# Patient Record
Sex: Male | Born: 1970 | Race: White | Hispanic: No | Marital: Single | Smoking: Former smoker
Health system: Southern US, Community
[De-identification: ages and names within clinical notes are randomized; demographics above are authoritative.]

## PROBLEM LIST (undated history)

## (undated) DIAGNOSIS — G7111 Myotonic muscular dystrophy: Secondary | ICD-10-CM

## (undated) HISTORY — PX: CHOLECYSTECTOMY: SHX55

## (undated) HISTORY — PX: ADENOIDECTOMY: SUR15

## (undated) HISTORY — DX: Myotonic muscular dystrophy: G71.11

---

## 1997-06-16 ENCOUNTER — Emergency Department (HOSPITAL_COMMUNITY): Admission: EM | Admit: 1997-06-16 | Discharge: 1997-06-16 | Payer: Self-pay | Admitting: Emergency Medicine

## 1997-11-01 ENCOUNTER — Encounter: Admission: RE | Admit: 1997-11-01 | Discharge: 1997-11-01 | Payer: Self-pay | Admitting: Sports Medicine

## 1998-02-18 ENCOUNTER — Encounter: Payer: Self-pay | Admitting: Emergency Medicine

## 1998-02-18 ENCOUNTER — Emergency Department (HOSPITAL_COMMUNITY): Admission: EM | Admit: 1998-02-18 | Discharge: 1998-02-18 | Payer: Self-pay | Admitting: Emergency Medicine

## 1998-02-21 ENCOUNTER — Encounter: Admission: RE | Admit: 1998-02-21 | Discharge: 1998-02-21 | Payer: Self-pay | Admitting: Sports Medicine

## 1998-04-14 ENCOUNTER — Encounter: Admission: RE | Admit: 1998-04-14 | Discharge: 1998-04-14 | Payer: Self-pay | Admitting: Family Medicine

## 1998-04-26 ENCOUNTER — Encounter: Admission: RE | Admit: 1998-04-26 | Discharge: 1998-04-26 | Payer: Self-pay | Admitting: Family Medicine

## 1998-07-12 ENCOUNTER — Encounter: Admission: RE | Admit: 1998-07-12 | Discharge: 1998-07-12 | Payer: Self-pay | Admitting: Family Medicine

## 1999-06-01 ENCOUNTER — Emergency Department (HOSPITAL_COMMUNITY): Admission: EM | Admit: 1999-06-01 | Discharge: 1999-06-01 | Payer: Self-pay | Admitting: Emergency Medicine

## 1999-11-01 ENCOUNTER — Encounter: Admission: RE | Admit: 1999-11-01 | Discharge: 1999-11-01 | Payer: Self-pay | Admitting: *Deleted

## 1999-11-01 ENCOUNTER — Encounter: Admission: RE | Admit: 1999-11-01 | Discharge: 1999-11-01 | Payer: Self-pay | Admitting: Family Medicine

## 2000-09-22 ENCOUNTER — Ambulatory Visit (HOSPITAL_COMMUNITY): Admission: RE | Admit: 2000-09-22 | Discharge: 2000-09-22 | Payer: Self-pay | Admitting: *Deleted

## 2000-12-01 ENCOUNTER — Emergency Department (HOSPITAL_COMMUNITY): Admission: EM | Admit: 2000-12-01 | Discharge: 2000-12-01 | Payer: Self-pay | Admitting: Emergency Medicine

## 2000-12-01 ENCOUNTER — Encounter: Payer: Self-pay | Admitting: Emergency Medicine

## 2003-05-26 ENCOUNTER — Encounter: Admission: RE | Admit: 2003-05-26 | Discharge: 2003-05-26 | Payer: Self-pay | Admitting: Family Medicine

## 2004-01-17 ENCOUNTER — Inpatient Hospital Stay (HOSPITAL_COMMUNITY): Admission: AD | Admit: 2004-01-17 | Discharge: 2004-01-17 | Payer: Self-pay | Admitting: Obstetrics & Gynecology

## 2004-03-08 ENCOUNTER — Emergency Department (HOSPITAL_COMMUNITY): Admission: EM | Admit: 2004-03-08 | Discharge: 2004-03-08 | Payer: Self-pay | Admitting: Family Medicine

## 2004-04-01 ENCOUNTER — Emergency Department (HOSPITAL_COMMUNITY): Admission: EM | Admit: 2004-04-01 | Discharge: 2004-04-01 | Payer: Self-pay | Admitting: Emergency Medicine

## 2004-07-19 ENCOUNTER — Ambulatory Visit: Payer: Self-pay | Admitting: Family Medicine

## 2004-11-27 ENCOUNTER — Emergency Department (HOSPITAL_COMMUNITY): Admission: EM | Admit: 2004-11-27 | Discharge: 2004-11-27 | Payer: Self-pay | Admitting: Emergency Medicine

## 2004-12-03 ENCOUNTER — Ambulatory Visit: Payer: Self-pay | Admitting: Family Medicine

## 2005-03-05 ENCOUNTER — Ambulatory Visit: Payer: Self-pay | Admitting: Sports Medicine

## 2006-04-07 ENCOUNTER — Ambulatory Visit: Payer: Self-pay | Admitting: Sports Medicine

## 2006-05-01 DIAGNOSIS — J309 Allergic rhinitis, unspecified: Secondary | ICD-10-CM | POA: Insufficient documentation

## 2006-08-26 ENCOUNTER — Telehealth (INDEPENDENT_AMBULATORY_CARE_PROVIDER_SITE_OTHER): Payer: Self-pay | Admitting: Family Medicine

## 2006-09-29 ENCOUNTER — Telehealth: Payer: Self-pay | Admitting: *Deleted

## 2007-01-10 ENCOUNTER — Emergency Department (HOSPITAL_COMMUNITY): Admission: EM | Admit: 2007-01-10 | Discharge: 2007-01-10 | Payer: Self-pay | Admitting: Emergency Medicine

## 2007-01-22 ENCOUNTER — Encounter: Payer: Self-pay | Admitting: Family Medicine

## 2007-07-23 ENCOUNTER — Ambulatory Visit: Payer: Self-pay | Admitting: Family Medicine

## 2007-07-23 ENCOUNTER — Encounter: Payer: Self-pay | Admitting: Family Medicine

## 2007-07-23 DIAGNOSIS — N3944 Nocturnal enuresis: Secondary | ICD-10-CM

## 2007-07-23 DIAGNOSIS — G7111 Myotonic muscular dystrophy: Secondary | ICD-10-CM

## 2007-07-23 LAB — CONVERTED CEMR LAB: Glucose, Bld: 93 mg/dL (ref 70–99)

## 2007-08-17 ENCOUNTER — Encounter: Payer: Self-pay | Admitting: Family Medicine

## 2007-12-31 ENCOUNTER — Ambulatory Visit: Payer: Self-pay | Admitting: Family Medicine

## 2008-05-25 ENCOUNTER — Ambulatory Visit: Payer: Self-pay | Admitting: Family Medicine

## 2008-06-06 ENCOUNTER — Ambulatory Visit: Payer: Self-pay | Admitting: Family Medicine

## 2008-06-06 DIAGNOSIS — D239 Other benign neoplasm of skin, unspecified: Secondary | ICD-10-CM | POA: Insufficient documentation

## 2008-09-02 ENCOUNTER — Encounter: Payer: Self-pay | Admitting: Family Medicine

## 2008-09-18 IMAGING — US US ABDOMEN COMPLETE
1 series · 14 of 25 positions shown · non-contrast
Comparison: none

CLINICAL DATA: Nausea and abdominal pain.  
 ABDOMEN ULTRASOUND:
TECHNIQUE: Complete abdominal ultrasound examination was performed including evaluation of the liver, gallbladder, bile ducts, pancreas, kidneys, spleen, IVC, and abdominal aorta.

[Series 1: unknown · 0.33mm/px · 14 of 66 slices shown]
[im 1/66]
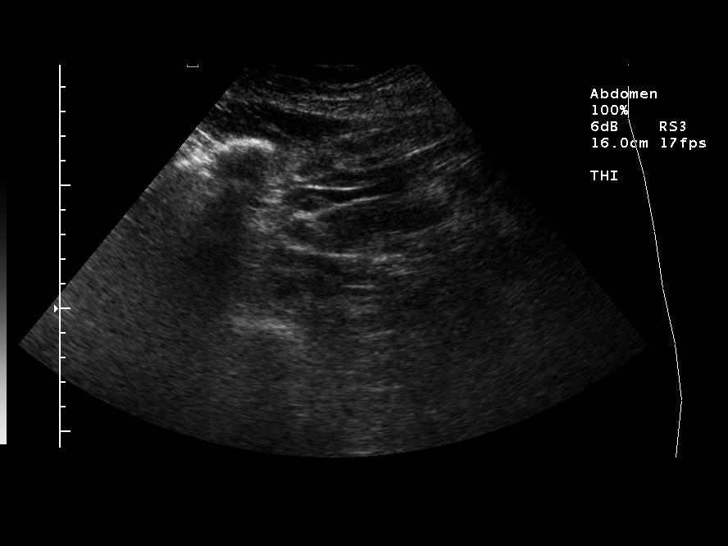
[im 6/66]
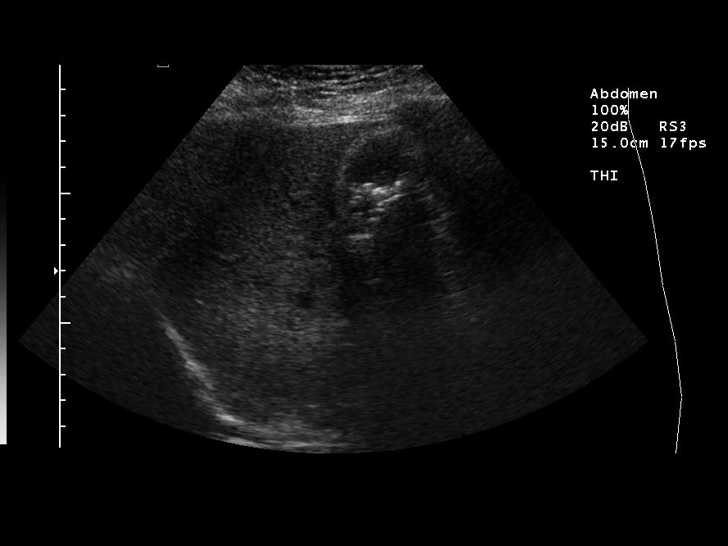
[im 11/66]
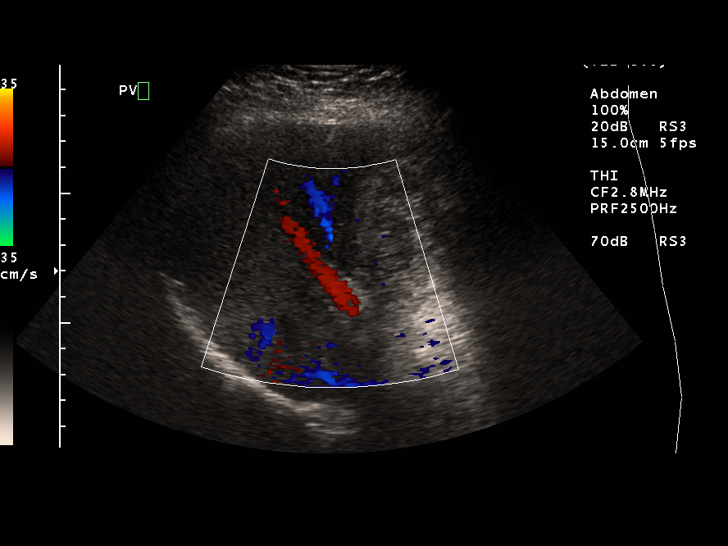
[im 17/66]
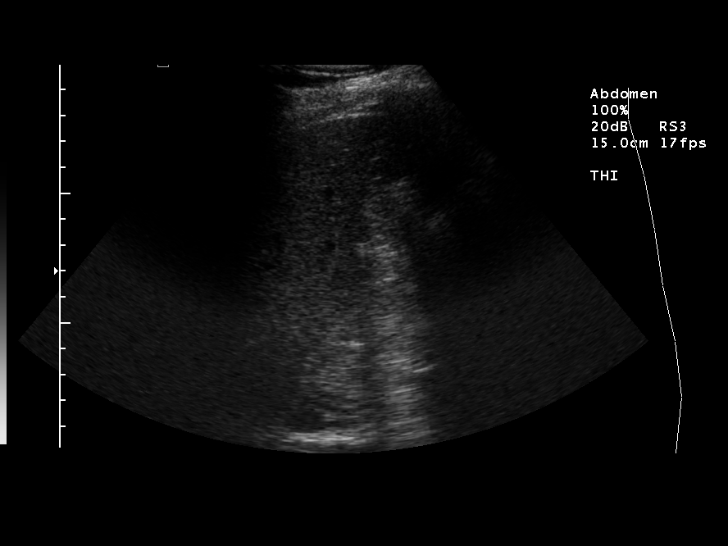
[im 22/66]
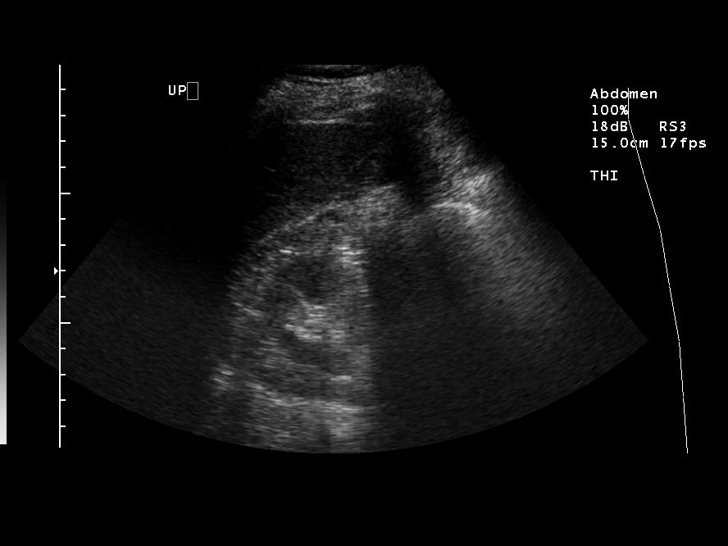
[im 25/66]
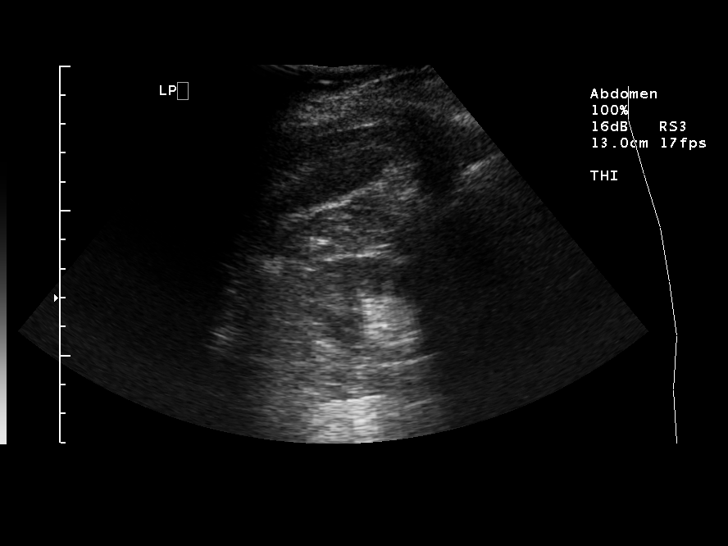
[im 30/66]
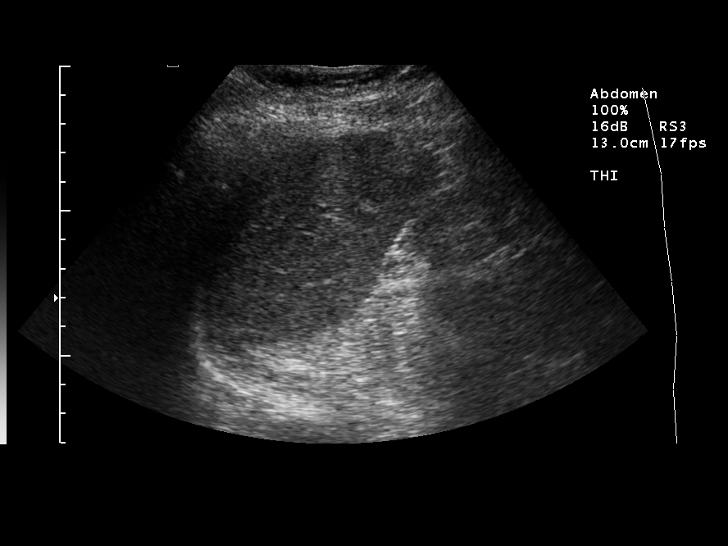
[im 36/66]
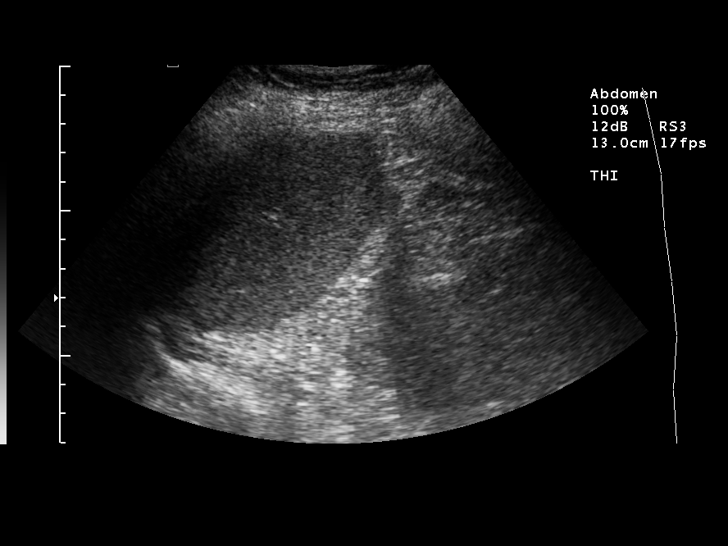
[im 41/66]
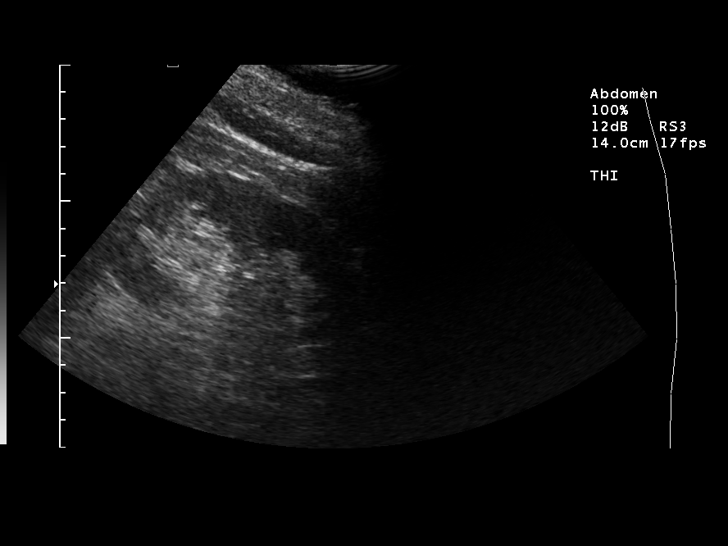
[im 44/66]
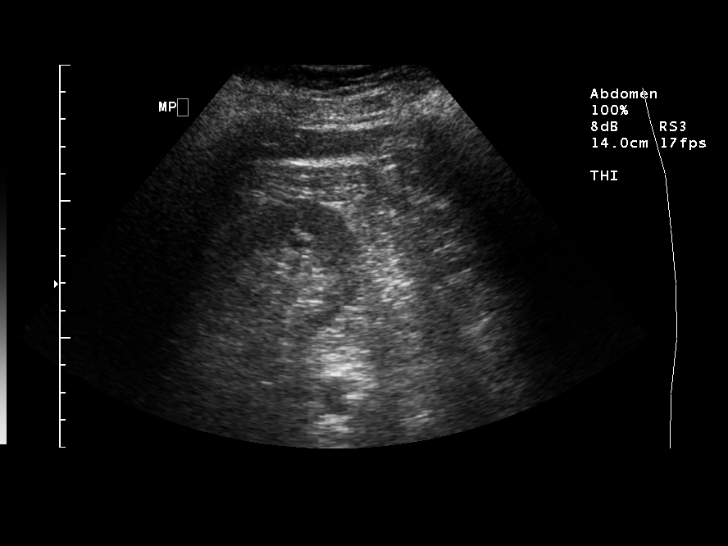
[im 49/66]
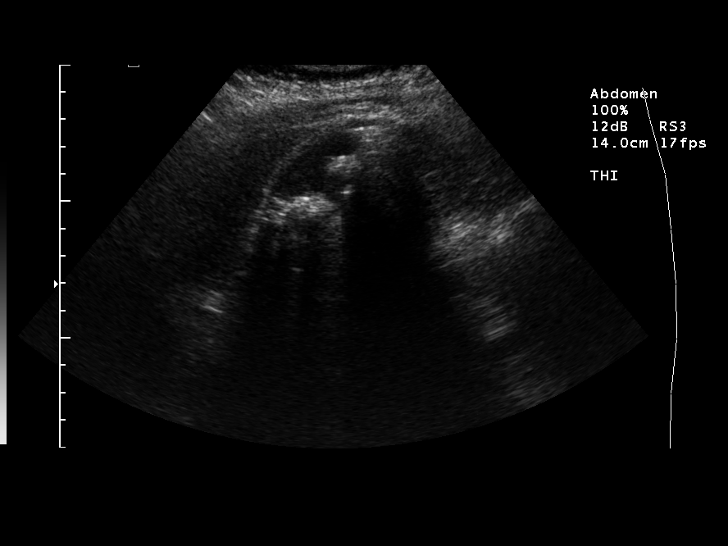
[im 55/66]
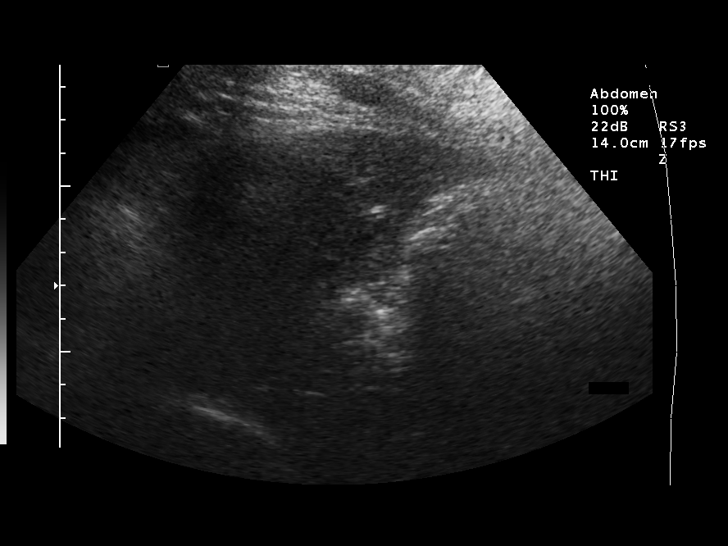
[im 60/66]
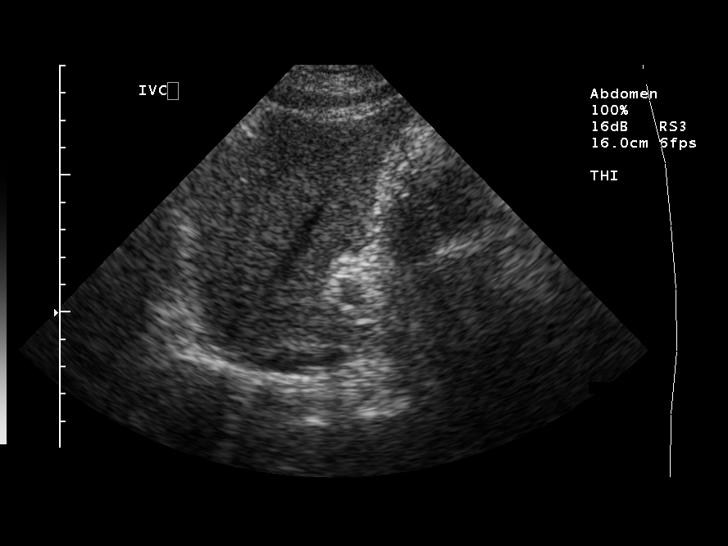
[im 66/66]
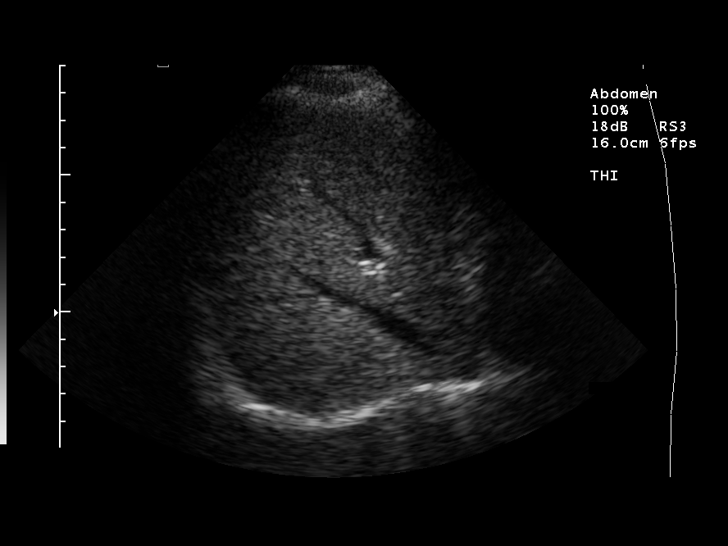

[14 of 25 positions shown; findings below may reference images not displayed]

FINDINGS: There are multiple gallstones.  No gallbladder wall thickening is present.  The patient was not tender on imaging of the gallbladder.  There is no biliary dilatation.  
 Portions of the pancreas and abdominal aorta are obscured by bowel gas.  The visualized spleen, IVC, liver and aorta appear unremarkable.  Both kidneys appear normal measuring 10.2 cm in length on the right and 10.7 cm on the left.
IMPRESSION: 1. Cholelithiasis.  No evidence of cholecystitis or biliary dilatation.
 2. No other significant findings.  Examination is limited by intestinal bowel gas.

## 2008-09-29 ENCOUNTER — Encounter: Payer: Self-pay | Admitting: Family Medicine

## 2009-03-01 ENCOUNTER — Emergency Department (HOSPITAL_COMMUNITY): Admission: EM | Admit: 2009-03-01 | Discharge: 2009-03-01 | Payer: Self-pay | Admitting: Emergency Medicine

## 2009-03-02 ENCOUNTER — Telehealth: Payer: Self-pay | Admitting: Family Medicine

## 2009-03-02 ENCOUNTER — Ambulatory Visit: Payer: Self-pay | Admitting: Family Medicine

## 2009-03-02 DIAGNOSIS — Z8639 Personal history of other endocrine, nutritional and metabolic disease: Secondary | ICD-10-CM

## 2009-03-02 DIAGNOSIS — A088 Other specified intestinal infections: Secondary | ICD-10-CM | POA: Insufficient documentation

## 2009-03-02 DIAGNOSIS — J209 Acute bronchitis, unspecified: Secondary | ICD-10-CM

## 2009-03-02 DIAGNOSIS — Z862 Personal history of diseases of the blood and blood-forming organs and certain disorders involving the immune mechanism: Secondary | ICD-10-CM

## 2009-03-02 DIAGNOSIS — R748 Abnormal levels of other serum enzymes: Secondary | ICD-10-CM | POA: Insufficient documentation

## 2009-03-23 ENCOUNTER — Ambulatory Visit: Payer: Self-pay | Admitting: Family Medicine

## 2009-08-21 ENCOUNTER — Encounter: Payer: Self-pay | Admitting: Family Medicine

## 2009-09-20 ENCOUNTER — Encounter: Payer: Self-pay | Admitting: Family Medicine

## 2009-09-26 ENCOUNTER — Encounter: Payer: Self-pay | Admitting: Family Medicine

## 2010-04-03 NOTE — Consult Note (Signed)
Summary: [facility]  [facility]   Imported By: Clydell Hakim 10/03/2009 11:49:27  _____________________________________________________________________  External Attachment:    Type:   Image     Comment:   External Document

## 2010-04-03 NOTE — Consult Note (Signed)
Summary: [facility] Office visit  [facility] Office visit   Imported By: De Nurse 08/28/2009 14:12:05  _____________________________________________________________________  External Attachment:    Type:   Image     Comment:   External Document

## 2010-04-03 NOTE — Assessment & Plan Note (Signed)
Summary: flu shot/eo   Nurse Visit   Vital Signs:  Patient profile:   41 year old male Temp:     97.8 degrees F  Vitals Entered By: Theresia Lo RN (March 23, 2009 3:22 PM)  Allergies: No Known Drug Allergies  Immunizations Administered:  Influenza Vaccine # 1:    Vaccine Type: Fluvax Non-MCR    Site: left deltoid    Mfr: GlaxoSmithKline    Dose: 0.5 ml    Route: IM    Given by: Theresia Lo RN    Exp. Date: 08/31/2009    Lot #: [id number]    VIS given: 10/11/2008  Flu Vaccine Consent Questions:    Do you have a history of severe allergic reactions to this vaccine? no    Any prior history of allergic reactions to egg and/or gelatin? no    Do you have a sensitivity to the preservative Thimersol? no    Do you have a past history of Guillan-Barre Syndrome? no    Do you currently have an acute febrile illness? no    Have you ever had a severe reaction to latex? no    Vaccine information given and explained to patient? yes  Orders Added: 1)  Influenza Vaccine NON MCR [00028] 2)  Admin 1st Vaccine [90471]     Vital Signs:  Patient profile:   40 year old male Temp:     97.8 degrees F  Vitals Entered By: Theresia Lo RN (March 23, 2009 3:22 PM)

## 2010-06-04 LAB — COMPREHENSIVE METABOLIC PANEL
ALT: 71 U/L — ABNORMAL HIGH (ref 0–53)
Alkaline Phosphatase: 126 U/L — ABNORMAL HIGH (ref 39–117)
CO2: 29 mEq/L (ref 19–32)
Calcium: 9 mg/dL (ref 8.4–10.5)
GFR calc Af Amer: >60 mL/min (ref >60)
Glucose, Bld: 102 mg/dL — ABNORMAL HIGH (ref 70–99)
Sodium: 139 mEq/L (ref 135–145)
Total Bilirubin: 2 mg/dL — ABNORMAL HIGH (ref 0.3–1.2)
Total Protein: 6.8 g/dL (ref 6.0–8.3)

## 2010-06-04 LAB — DIFFERENTIAL
Basophils Absolute: 0 10*3/uL (ref 0.0–0.1)
Lymphocytes Relative: 16 % (ref 12–46)
Monocytes Relative: 11 % (ref 3–12)

## 2010-06-04 LAB — CBC
HCT: 47.1 % (ref 39.0–52.0)
Hemoglobin: 16.1 g/dL (ref 13.0–17.0)
MCV: 88.2 fL (ref 78.0–100.0)
Platelets: 166 10*3/uL (ref 150–400)
RDW: 13.2 % (ref 11.5–15.5)
WBC: 6.7 10*3/uL (ref 4.0–10.5)

## 2010-06-04 LAB — URINALYSIS, ROUTINE W REFLEX MICROSCOPIC
Hgb urine dipstick: NEGATIVE
Ketones, ur: NEGATIVE mg/dL
Nitrite: NEGATIVE
Protein, ur: NEGATIVE mg/dL
Specific Gravity, Urine: 1.023 (ref 1.005–1.030)

## 2010-06-13 ENCOUNTER — Ambulatory Visit (INDEPENDENT_AMBULATORY_CARE_PROVIDER_SITE_OTHER): Payer: Medicaid Other | Admitting: Family Medicine

## 2010-06-13 ENCOUNTER — Encounter: Payer: Self-pay | Admitting: Family Medicine

## 2010-06-13 DIAGNOSIS — Z8639 Personal history of other endocrine, nutritional and metabolic disease: Secondary | ICD-10-CM

## 2010-06-13 DIAGNOSIS — G7111 Myotonic muscular dystrophy: Secondary | ICD-10-CM

## 2010-06-15 ENCOUNTER — Encounter: Payer: Self-pay | Admitting: Family Medicine

## 2010-06-15 NOTE — Assessment & Plan Note (Signed)
Discussed this with patient.  He states no one has ever told him he has had problems with liver in past. After discussion, patient declines any screening or LFTs at this time.  Recommended he return in 3-6 months for fu LFTs.   He states he will think about this.

## 2010-06-15 NOTE — Assessment & Plan Note (Signed)
No current complications.  No changes in management per Cards/Neurology.   Will follow.

## 2010-06-15 NOTE — Progress Notes (Signed)
  Subjective:    Patient ID: Peter Steele, male    DOB: 08/01/1970, 40 y.o.   MRN: [id number]  HPI Patient presents for annual CPE Denies any current symptoms, recent illnesses, medications, hospitalizations. 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  Followed by [facility]rology and [facility] for this.  No problems or concerns, simply yearly follow-up exams with them.  No complications, worsening of weakness.   Review of Systems The patient denies fever, unusual weight change, decreased hearing, chest pain, palpitations, pre-syncopal or syncopal episodes, dyspnea on exertion, prolonged cough, hemoptysis, change in bowel habits, melena, hematochezia, severe indigestion/heartburn, nausea/vomiting/abdominal pain, genital sores, muscle weakness, difficulty walking, abnormal bleeding, or enlarged lymph nodes.       Objective:   Physical Exam Gen:  Alert, cooperative patient who appears stated age in no acute distress.  Vital signs reviewed. HEENT:  [place]/AT.  EOMI, PERRL.  MMM, tonsils non-erythematous, non-edematous.  External ears WNL, Bilateral TM's normal without retraction, redness or bulging.  Neck:  Supple, no LAD Cardiac:  Regular rate and rhythm without murmur auscultated.  Good S1/S2. Pulm:  Clear to auscultation bilaterally with good air movement.  No wheezes or rales noted.   Abd:  Soft/nondistended/nontender.  Good bowel sounds throughout all four quadrants.  No masses noted.  Unable to palpate any hepatomegaly Ext:  No clubbing/cyanosis/erythema.  No edema noted bilateral lower extremities.   Neuro:  CN II-XII intact.  No focal sensory or motor deficits.  Alert and oriented x 3.        Assessment & Plan:

## 2010-11-08 IMAGING — CR DG CHEST 2V
2 series · 2 of 2 positions shown · non-contrast
Comparison: 11/27/2004

CLINICAL DATA: Vomiting.  Cough.  Congestion.

CHEST - 2 VIEW

[w chest pa]
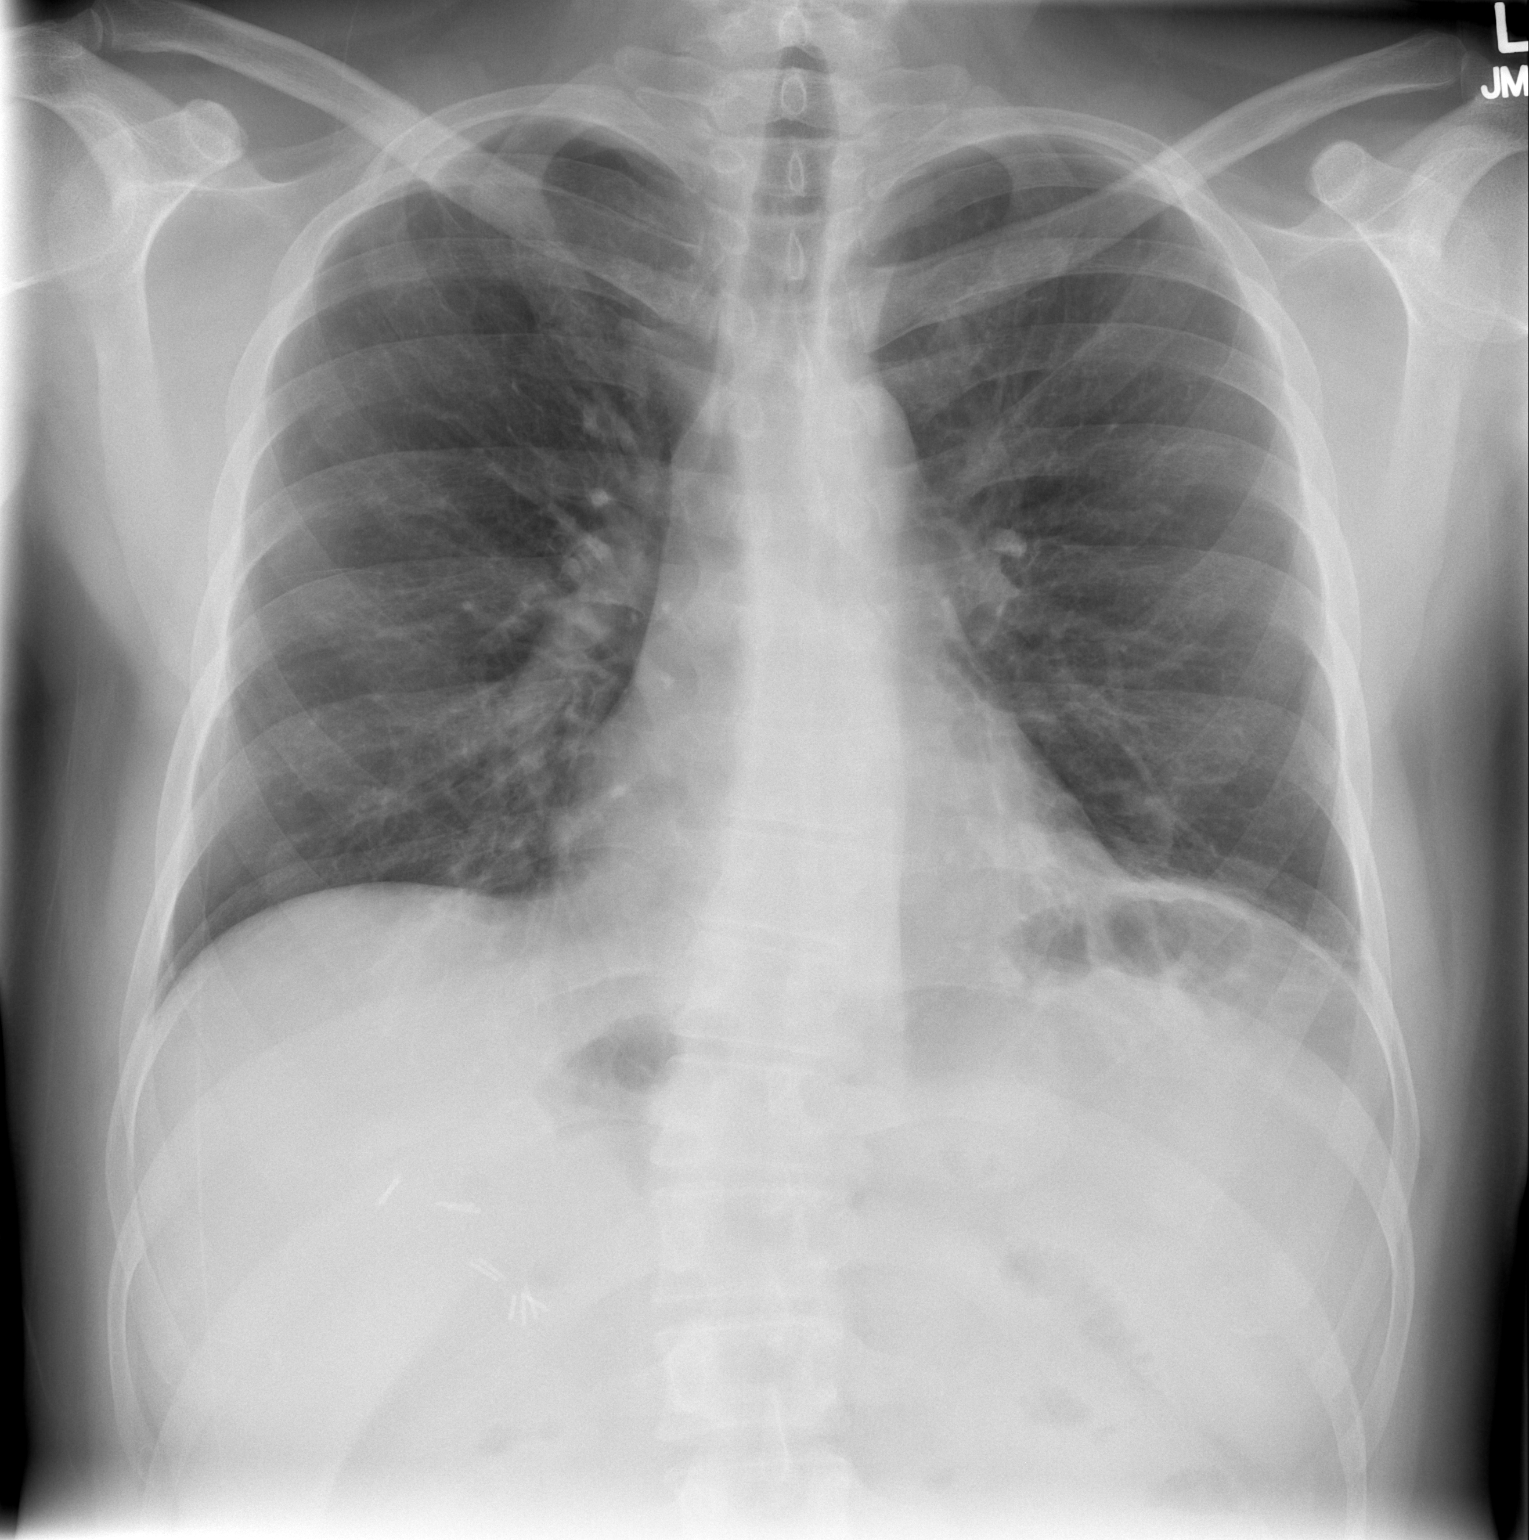

[w chest lat]
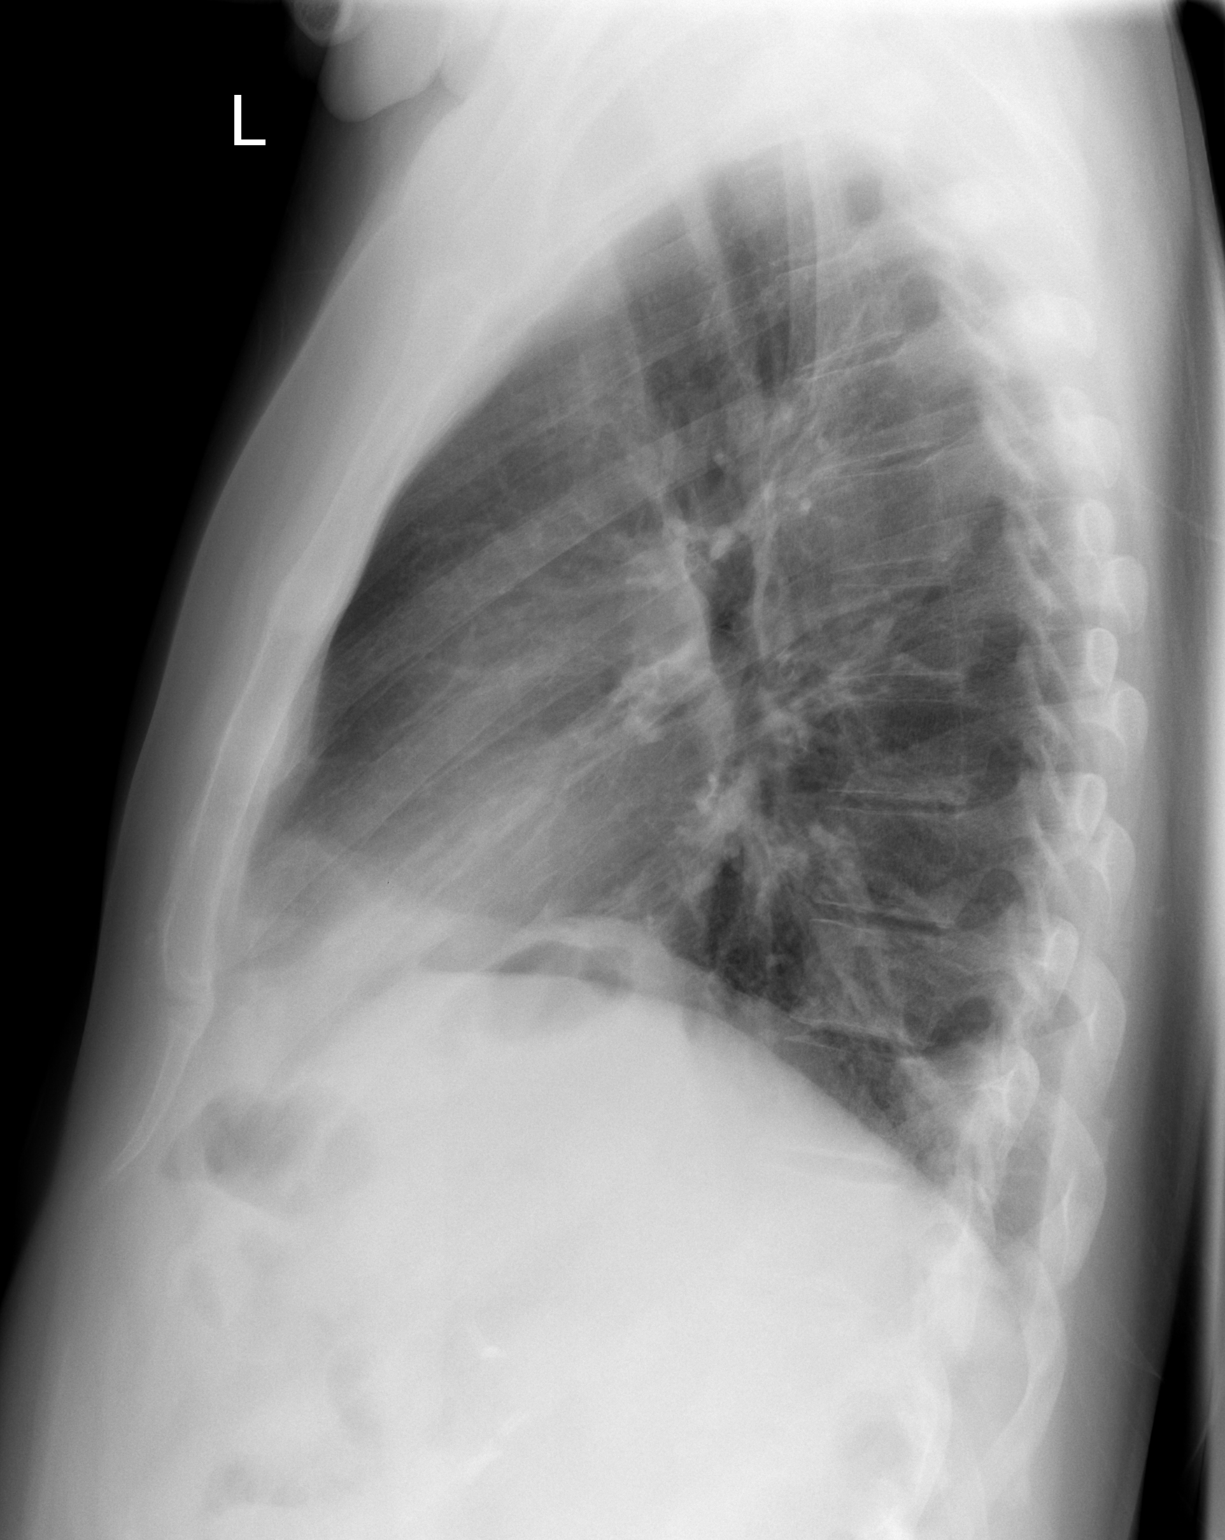

[2 of 2 positions shown; findings below may reference images not displayed]

FINDINGS: Midline trachea.  Normal heart size and mediastinal
contours. No pleural effusion or pneumothorax.  Diffuse
peribronchial thickening.  Volume loss and scar/atelectasis at the
lung bases.  Surgical clips in the right upper quadrant.  Mild S-
shaped spinal curvature.
IMPRESSION: 1.  No acute cardiopulmonary disease.
2.  Mild peribronchial thickening which may relate to chronic
bronchitis or smoking.

## 2010-12-11 LAB — COMPREHENSIVE METABOLIC PANEL
AST: 27
Calcium: 9.2
Creatinine, Ser: 0.87
GFR calc Af Amer: >60
GFR calc non Af Amer: >60
Glucose, Bld: 127 — ABNORMAL HIGH
Potassium: 4
Sodium: 145
Total Bilirubin: 1
Total Protein: 6.6

## 2010-12-11 LAB — CBC
Platelets: 236
WBC: 7.9

## 2010-12-11 LAB — DIFFERENTIAL
Basophils Absolute: 0
Basophils Relative: 0
Eosinophils Absolute: 0.1
Lymphocytes Relative: 19
Monocytes Relative: 8

## 2010-12-11 LAB — LIPASE, BLOOD: Lipase: 18

## 2017-12-23 ENCOUNTER — Encounter

## 2018-04-16 ENCOUNTER — Other Ambulatory Visit: Payer: Self-pay

## 2018-04-16 ENCOUNTER — Ambulatory Visit: Payer: Medicaid Other | Admitting: Family Medicine

## 2018-04-16 ENCOUNTER — Encounter: Payer: Self-pay | Admitting: Family Medicine

## 2018-04-16 ENCOUNTER — Ambulatory Visit (HOSPITAL_COMMUNITY)
Admission: RE | Admit: 2018-04-16 | Discharge: 2018-04-16 | Disposition: A | Discharge status: Home / Self Care | Payer: Medicaid Other | Source: Ambulatory Visit | Attending: Family Medicine | Admitting: Family Medicine

## 2018-04-16 VITALS — BP 110/80 | HR 67 | Temp 97.6°F | Ht 66.0 in | Wt 174.4 lb

## 2018-04-16 DIAGNOSIS — H918X1 Other specified hearing loss, right ear: Secondary | ICD-10-CM | POA: Diagnosis not present

## 2018-04-16 DIAGNOSIS — I447 Left bundle-branch block, unspecified: Secondary | ICD-10-CM

## 2018-04-16 DIAGNOSIS — G7111 Myotonic muscular dystrophy: Secondary | ICD-10-CM

## 2018-04-16 DIAGNOSIS — R1310 Dysphagia, unspecified: Secondary | ICD-10-CM | POA: Diagnosis not present

## 2018-04-16 DIAGNOSIS — I440 Atrioventricular block, first degree: Secondary | ICD-10-CM | POA: Diagnosis not present

## 2018-04-16 LAB — POCT GLYCOSYLATED HEMOGLOBIN (HGB A1C): HEMOGLOBIN A1C: 5.3 % (ref 4.0–5.6)

## 2018-04-16 NOTE — Progress Notes (Signed)
Subjective:     Peter Steele is a 48 y.o. male and is here for a comprehensive physical exam. The patient reports no problems.  His primary goal in this appointment today is to establish care with a primary care provider to coordinate his care with specialists for his myotonic muscular dystrophy.  He has not seen any medical provider in about 10 years.  We spoke at length today much of the social history seen below in addition to his myotonic muscular dystrophy.  He reports that he has had trouble swallowing for years related to his muscular dystrophy and he now avoids certain foods like steak.  He reports that he is seen neurology and cardiology in the past at the he has never been prescribed medication to be taken regularly.  He does not recall being diagnosed with any specific problems from the specialists.  Social History   Socioeconomic History  . Marital status: Legally Separated    Spouse name: Not on file  . Number of children: Not on file  . Years of education: Not on file  . Highest education level: Not on file  Occupational History  . Not on file  Social Needs  . Financial resource strain: Not on file  . Food insecurity:    Worry: Not on file    Inability: Not on file  . Transportation needs:    Medical: Not on file    Non-medical: Not on file  Tobacco Use  . Smoking status: Former Smoker    Last attempt to quit: 06/13/1995    Years since quitting: 22.8  . Smokeless tobacco: Never Used  Substance and Sexual Activity  . Alcohol use: Not on file  . Drug use: Not on file  . Sexual activity: Not on file  Lifestyle  . Physical activity:    Days per week: Not on file    Minutes per session: Not on file  . Stress: Not on file  Relationships  . Social connections:    Talks on phone: Not on file    Gets together: Not on file    Attends religious service: Not on file    Active member of club or organization: Not on file    Attends meetings of clubs or  organizations: Not on file    Relationship status: Not on file  . Intimate partner violence:    Fear of current or ex partner: Not on file    Emotionally abused: Not on file    Physically abused: Not on file    Forced sexual activity: Not on file  Other Topics Concern  . Not on file  Social History Narrative  . Not on file   Health Maintenance  Topic Date Due  . HIV Screening  12/06/1984  . TETANUS/TDAP  02/02/2012  . INFLUENZA VACCINE  10/02/2017    The following portions of the patient's history were reviewed and updated as appropriate: allergies, current medications, past family history, past medical history, past social history, past surgical history and problem list.  Review of Systems Pertinent items are noted in HPI.   Objective:    BP 110/80   Pulse 67   Temp 97.6 F (36.4 C) (Oral)   Ht 5\' 6"  (1.676 m)   Wt 174 lb 6.4 oz (79.1 kg)   SpO2 99%   BMI 28.15 kg/m  General appearance: alert, cooperative and appears stated age Head: Normocephalic, without obvious abnormality, atraumatic, polyp-like 1.5 cm exophytic nevus noted on right parietal scalp  Ears: TMs visualized bilaterally normal-appearing, gray, no signs of lesion, no cerumen impaction Throat: lips, mucosa, and tongue normal; teeth and gums normal Neck: no adenopathy, no carotid bruit, no JVD and supple, symmetrical, trachea midline Back: symmetric, no curvature. ROM normal. No CVA tenderness. Lungs: clear to auscultation bilaterally Chest wall: no tenderness Heart: regular rate and rhythm, S1, S2 normal, no murmur, click, rub or gallop Abdomen: soft, non-tender; bowel sounds normal; no masses,  no organomegaly Extremities: lack of muscle mass on extremities Skin: Skin color, texture, turgor normal. No rashes or lesions Neurologic: Gait: Normal cranial nerves grossly intact   Assessment:   A 48 year old male with a previous medical history significant for myotonic muscular dystrophy presenting for a  visit to establish care.  He notes that he has not seen many medical providers for about 10 years and believes he may require some follow-up.  Plan:  Routine care for myoclonic muscular dystrophy -Referral to ophthalmology, assessment for cataracts every 2 years -A1c within normal limits today in clinic -Screening EKG for conduction delays showed first-degree block in addition to incomplete left bundle branch block.  Will refer to cardiology. -BMP,CBC,CK, lipid panel ordered today but pt did not want to have blood drawn so they were put as future orders and he was encouraged to return for the blood draw soon.  Hearing loss of the right ear -referral to audiology  Dysphagia -referral to speech language pathology   See After Visit Summary for Counseling Recommendations

## 2018-04-16 NOTE — Patient Instructions (Addendum)
It was great to meet you today!  I'm glad we got to cover some of the basics of your medical history. Your labs from today looked good but I would like you to come back for blood work when you feel comfortable. You can walk into the clinic when you are able (an advanced call is nice) and have the blood work done at [facility].  I have put in referrals to ENT and Ophthalmology.  I will follow up with possible referrals to Cardiology and Neurology.

## 2018-04-17 ENCOUNTER — Other Ambulatory Visit (HOSPITAL_COMMUNITY): Payer: Medicaid Other

## 2018-04-17 ENCOUNTER — Inpatient Hospital Stay (HOSPITAL_COMMUNITY): Admission: RE | Admit: 2018-04-17 | Payer: Medicaid Other | Source: Ambulatory Visit

## 2018-05-21 ENCOUNTER — Encounter: Payer: Self-pay | Admitting: Family Medicine

## 2018-07-07 ENCOUNTER — Telehealth: Payer: Self-pay | Admitting: *Deleted

## 2018-07-07 NOTE — Telephone Encounter (Signed)
Unable to leave message due to voicemail not set up.  Will continue to try and reach him.  Letter also mailed to patient. Jazmin Hartsell,CMA

## 2018-10-19 DIAGNOSIS — H52223 Regular astigmatism, bilateral: Secondary | ICD-10-CM | POA: Diagnosis not present

## 2018-10-19 DIAGNOSIS — H5213 Myopia, bilateral: Secondary | ICD-10-CM | POA: Diagnosis not present

## 2018-10-20 DIAGNOSIS — H5213 Myopia, bilateral: Secondary | ICD-10-CM | POA: Diagnosis not present

## 2018-11-10 DIAGNOSIS — H353111 Nonexudative age-related macular degeneration, right eye, early dry stage: Secondary | ICD-10-CM | POA: Diagnosis not present

## 2018-11-10 DIAGNOSIS — H0102A Squamous blepharitis right eye, upper and lower eyelids: Secondary | ICD-10-CM | POA: Diagnosis not present

## 2018-11-10 DIAGNOSIS — H0102B Squamous blepharitis left eye, upper and lower eyelids: Secondary | ICD-10-CM | POA: Diagnosis not present

## 2018-11-25 DIAGNOSIS — H524 Presbyopia: Secondary | ICD-10-CM | POA: Diagnosis not present

## 2019-04-16 ENCOUNTER — Other Ambulatory Visit: Payer: Medicaid Other

## 2019-04-16 ENCOUNTER — Other Ambulatory Visit: Payer: Self-pay

## 2019-04-16 ENCOUNTER — Ambulatory Visit: Payer: Medicaid Other | Attending: Internal Medicine

## 2019-04-16 DIAGNOSIS — Z20822 Contact with and (suspected) exposure to covid-19: Secondary | ICD-10-CM

## 2019-04-17 LAB — NOVEL CORONAVIRUS, NAA: SARS-CoV-2, NAA: NOT DETECTED

## 2019-05-10 DIAGNOSIS — H353111 Nonexudative age-related macular degeneration, right eye, early dry stage: Secondary | ICD-10-CM | POA: Diagnosis not present

## 2019-05-10 DIAGNOSIS — H25013 Cortical age-related cataract, bilateral: Secondary | ICD-10-CM | POA: Diagnosis not present

## 2019-05-10 DIAGNOSIS — H0102A Squamous blepharitis right eye, upper and lower eyelids: Secondary | ICD-10-CM | POA: Diagnosis not present

## 2019-05-10 DIAGNOSIS — H0102B Squamous blepharitis left eye, upper and lower eyelids: Secondary | ICD-10-CM | POA: Diagnosis not present

## 2019-05-21 ENCOUNTER — Other Ambulatory Visit: Payer: Self-pay

## 2019-05-21 ENCOUNTER — Ambulatory Visit (HOSPITAL_COMMUNITY)
Admission: RE | Admit: 2019-05-21 | Discharge: 2019-05-21 | Disposition: A | Discharge status: Home / Self Care | Payer: Medicaid Other | Source: Ambulatory Visit | Attending: Family Medicine | Admitting: Family Medicine

## 2019-05-21 ENCOUNTER — Encounter: Payer: Self-pay | Admitting: Family Medicine

## 2019-05-21 ENCOUNTER — Ambulatory Visit: Payer: Medicaid Other | Admitting: Family Medicine

## 2019-05-21 VITALS — BP 102/60 | HR 86 | Ht 66.0 in | Wt 167.2 lb

## 2019-05-21 DIAGNOSIS — G7111 Myotonic muscular dystrophy: Secondary | ICD-10-CM | POA: Diagnosis not present

## 2019-05-21 NOTE — Patient Instructions (Signed)
It was good to see you again Peter Steele.  We will place those orders for referral to cardiology, neurology, speech therapy, audiology.  Please make sure that you have our number in your phone and please stop at the front desk to make sure we have the best contact information for you.  The phone number for the clinic is [phone].  Please call the clinic if you have not received any phone calls about these referrals in the next 2 weeks.  I will let you know if there is any concerning findings in your lab work.

## 2019-05-22 LAB — CBC
Hematocrit: 48.5 % (ref 37.5–51.0)
Hemoglobin: 16 g/dL (ref 13.0–17.7)
MCH: 29 pg (ref 26.6–33.0)
MCHC: 33 g/dL (ref 31.5–35.7)
MCV: 88 fL (ref 79–97)
Platelets: 221 10*3/uL (ref 150–450)
RBC: 5.51 x10E6/uL (ref 4.14–5.80)
RDW: 13.7 % (ref 11.6–15.4)
WBC: 7.5 10*3/uL (ref 3.4–10.8)

## 2019-05-22 LAB — BASIC METABOLIC PANEL
BUN/Creatinine Ratio: 16 (ref 9–20)
BUN: 15 mg/dL (ref 6–24)
CO2: 25 mmol/L (ref 20–29)
Calcium: 9.2 mg/dL (ref 8.7–10.2)
Chloride: 112 mmol/L — ABNORMAL HIGH (ref 96–106)
Creatinine, Ser: 0.93 mg/dL (ref 0.76–1.27)
GFR calc Af Amer: 111 mL/min/{1.73_m2} (ref >59)
GFR calc non Af Amer: 96 mL/min/{1.73_m2} (ref >59)
Glucose: 97 mg/dL (ref 65–99)
Potassium: 4.9 mmol/L (ref 3.5–5.2)
Sodium: 149 mmol/L — ABNORMAL HIGH (ref 134–144)

## 2019-05-22 LAB — CK: Total CK: 66 U/L (ref 49–439)

## 2019-05-23 NOTE — Assessment & Plan Note (Addendum)
His EKG continues to show first-degree heart block with an interventricular conduction delay.  His access to care was impeded last year due to difficulty contacting him via phone.  He was encouraged to set up a voicemail on his phone so that secure messages could be left.  We verified that we have the correct phone number in our system. -Ambulatory referral to cardiology -Ambulatory referral to neurology -Ambulatory referral to speech therapy -Ambulatory referral to audiology -No abnormalities noted on blood work

## 2019-05-23 NOTE — Progress Notes (Signed)
    SUBJECTIVE:   CHIEF COMPLAINT / HPI:   Peter Steele presents to clinic today for his annual checkup.  He was seen 1 year ago and was hoping to get reconnected with many specialists for routine care of his myotonic muscular dystrophy.  At the last visit, referrals to cardiology, speech therapy and audiology were placed.  None of these referrals were followed up with due to difficulties communicating with Peter Steele.  In clinic today, he has no new complaints and notes again that he would like to be reconnected with his specialists.  Last year he noted some difficulty swallowing.  He continues to have some difficulty swallowing though this does not seem to have progressed at all since our last conversation.  PERTINENT  PMH / PSH: Myotonic muscular dystrophy  OBJECTIVE:   BP 102/60   Pulse 86   Ht 5\' 6"  (1.676 m)   Wt 167 lb 4 oz (75.9 kg)   SpO2 95%   BMI 26.99 kg/m    General: Alert and cooperative and appears to be in no acute distress HEENT: Neck non-tender without lymphadenopathy, masses or thyromegaly Cardio: Normal S1 and S2, no S3 or S4. Rhythm is regular. No murmurs or rubs.   Pulm: Clear to auscultation bilaterally, no crackles, wheezing, or diminished breath sounds. Normal respiratory effort Abdomen: Bowel sounds normal. Abdomen soft and non-tender.  Extremities: No peripheral edema. Warm/ well perfused.  Strong radial pulse. Neuro: Cranial nerves grossly intact   ASSESSMENT/PLAN:   MYOTONIC MUSCULAR DYSTROPHY His EKG continues to show first-degree heart block with an interventricular conduction delay.  His access to care was impeded last year due to difficulty contacting him via phone.  He was encouraged to set up a voicemail on his phone so that secure messages could be left.  We verified that we have the correct phone number in our system. -Ambulatory referral to cardiology -Ambulatory referral to neurology -Ambulatory referral to speech therapy -Ambulatory  referral to audiology -No abnormalities noted on blood work     Peter Haymaker, MD [place]

## 2019-06-08 NOTE — Progress Notes (Signed)
Cardiology Office Note:    Date:  06/10/2019   ID:  Peter Steele, DOB Apr 30, 1970, MRN [id number]  PCP:  Matilde Haymaker, MD  Cardiologist:  No primary care provider on file.  Electrophysiologist:  None   Referring MD: Lind Covert, *   Chief Complaint  Patient presents with  . Abnormal ECG    History of Present Illness:    Peter Steele is a 49 y.o. male with a hx of myotonic muscular dystrophy who is referred by Dr. Erin Hearing for evaluation of cardiac involvement of his muscular dystrophy.  Mr. Bibb reports that he has been doing well.  He denies any chest pain, dyspnea, lightheadedness, syncope, or palpitations.  He does exercises at home gym, denies any symptoms.   Past Medical History:  Diagnosis Date  . Myotonic muscular dystrophy ([place])    Followed by [facility] Neurology for this    Past Surgical History:  Procedure Laterality Date  . CHOLECYSTECTOMY     Can't remember year    Current Medications: No outpatient medications have been marked as taking for the 06/10/19 encounter (Office Visit) with Donato Heinz, MD.     Allergies:   Patient has no known allergies.   Social History   Socioeconomic History  . Marital status: Single    Spouse name: Not on file  . Number of children: Not on file  . Years of education: Not on file  . Highest education level: Not on file  Occupational History  . Occupation: not employed  Tobacco Use  . Smoking status: Former Smoker    Quit date: 06/13/1995    Years since quitting: 24.0  . Smokeless tobacco: Never Used  Substance and Sexual Activity  . Alcohol use: Not Currently  . Drug use: Not Currently  . Sexual activity: Not Currently    Comment: Girlfriend died 5 years ago. not active since  Other Topics Concern  . Not on file  Social History Narrative  . Not on file   Social Determinants of Health   Financial Resource Strain:   . Difficulty of Paying Living Expenses:   Food Insecurity:    . Worried About Charity fundraiser in the Last Year:   . Arboriculturist in the Last Year:   Transportation Needs:   . Film/video editor (Medical):   Marland Kitchen Lack of Transportation (Non-Medical):   Physical Activity:   . Days of Exercise per Week:   . Minutes of Exercise per Session:   Stress:   . Feeling of Stress :   Social Connections:   . Frequency of Communication with Friends and Family:   . Frequency of Social Gatherings with Friends and Family:   . Attends Religious Services:   . Active Member of Clubs or Organizations:   . Attends Archivist Meetings:   Marland Kitchen Marital Status:      Family History: The patient's family history includes Asthma in his sister; Diabetes in his father; Early death in his mother; Heart disease in his father, mother, and sister; High blood pressure in his father and sister; Hyperlipidemia in his father and sister; Stroke in his mother.  ROS:   Please see the history of present illness.     All other systems reviewed and are negative.  EKGs/Labs/Other Studies Reviewed:    The following studies were reviewed today:   EKG:  EKG is ordered today.  The ekg ordered today demonstrates normal sinus rhythm, rate 65, early repolarization, nonspecific  interventricular conduction delay  Recent Labs: 05/21/2019: BUN 15; Creatinine, Ser 0.93; Hemoglobin 16.0; Platelets 221; Potassium 4.9; Sodium 149  Recent Lipid Panel No results found for: CHOL, TRIG, HDL, CHOLHDL, VLDL, LDLCALC, LDLDIRECT  Physical Exam:    VS:  BP 110/78   Pulse 65   Temp (!) 97 F (36.1 C)   Ht 5\' 5"  (1.651 m)   Wt 162 lb (73.5 kg)   SpO2 97%   BMI 26.96 kg/m     Wt Readings from Last 3 Encounters:  06/10/19 162 lb (73.5 kg)  05/21/19 167 lb 4 oz (75.9 kg)  04/16/18 174 lb 6.4 oz (79.1 kg)     GEN:  in no acute distress HEENT: Normal NECK: No JVD LYMPHATICS: No lymphadenopathy CARDIAC: RRR, no murmurs, rubs, gallops RESPIRATORY:  Clear to auscultation without  rales, wheezing or rhonchi  ABDOMEN: Soft, non-tender, non-distended  MUSCULOSKELETAL:  No edema SKIN: Warm and dry NEUROLOGIC:  Alert and oriented x 3 PSYCHIATRIC:  Normal affect   ASSESSMENT:    1. MYOTONIC MUSCULAR DYSTROPHY   2. Abnormal EKG    PLAN:    Myotonic muscular dystrophy: Has been associated with increased risk of cardiomyopathy, conduction disorders, and arrhythmias.  He is currently asymptomatic.  EKG with nonspecific intraventricular conduction delay.  Will check echocardiogram  RTC in 3 months   Medication Adjustments/Labs and Tests Ordered: Current medicines are reviewed at length with the patient today.  Concerns regarding medicines are outlined above.  Orders Placed This Encounter  Procedures  . EKG 12-Lead  . ECHOCARDIOGRAM COMPLETE   No orders of the defined types were placed in this encounter.   Patient Instructions  Medication Instructions:  Your physician recommends that you continue on your current medications as directed. Please refer to the Current Medication list given to you today.  Lab Work: NONE  Testing/Procedures: Your physician has requested that you have an echocardiogram. Echocardiography is a painless test that uses sound waves to create images of your heart. It provides your doctor with information about the size and shape of your heart and how well your heart's chambers and valves are working. This procedure takes approximately one hour. There are no restrictions for this procedure.  This will be done at our [facility] location:  [place]: At [facility], you and your health needs are our priority.  As part of our continuing mission to provide you with exceptional heart care, we have created designated Provider Care Teams.  These Care Teams include your primary Cardiologist (physician) and Advanced Practice Providers (APPs -  Physician Assistants and Nurse Practitioners) who all work together to  provide you with the care you need, when you need it.  We recommend signing up for the patient portal called "MyChart".  Sign up information is provided on this After Visit Summary.  MyChart is used to connect with patients for Virtual Visits (Telemedicine).  Patients are able to view lab/test results, encounter notes, upcoming appointments, etc.  Non-urgent messages can be sent to your provider as well.   To learn more about what you can do with MyChart, go to [email or web address].    Your next appointment:   3 month(s)  The format for your next appointment:   In Person  Provider:   Oswaldo Milian, MD         Signed, Donato Heinz, MD  06/10/2019 9:28 AM    [place]

## 2019-06-10 ENCOUNTER — Encounter: Payer: Self-pay | Admitting: Cardiology

## 2019-06-10 ENCOUNTER — Other Ambulatory Visit: Payer: Self-pay

## 2019-06-10 ENCOUNTER — Ambulatory Visit: Payer: Medicaid Other | Admitting: Cardiology

## 2019-06-10 VITALS — BP 110/78 | HR 65 | Temp 97.0°F | Ht 65.0 in | Wt 162.0 lb

## 2019-06-10 DIAGNOSIS — G7111 Myotonic muscular dystrophy: Secondary | ICD-10-CM

## 2019-06-10 DIAGNOSIS — R9431 Abnormal electrocardiogram [ECG] [EKG]: Secondary | ICD-10-CM | POA: Diagnosis not present

## 2019-06-10 NOTE — Patient Instructions (Signed)
Medication Instructions:  Your physician recommends that you continue on your current medications as directed. Please refer to the Current Medication list given to you today.  Lab Work: NONE  Testing/Procedures: Your physician has requested that you have an echocardiogram. Echocardiography is a painless test that uses sound waves to create images of your heart. It provides your doctor with information about the size and shape of your heart and how well your heart's chambers and valves are working. This procedure takes approximately one hour. There are no restrictions for this procedure.  This will be done at our [facility] location:  [place]: At [facility], you and your health needs are our priority.  As part of our continuing mission to provide you with exceptional heart care, we have created designated Provider Care Teams.  These Care Teams include your primary Cardiologist (physician) and Advanced Practice Providers (APPs -  Physician Assistants and Nurse Practitioners) who all work together to provide you with the care you need, when you need it.  We recommend signing up for the patient portal called "MyChart".  Sign up information is provided on this After Visit Summary.  MyChart is used to connect with patients for Virtual Visits (Telemedicine).  Patients are able to view lab/test results, encounter notes, upcoming appointments, etc.  Non-urgent messages can be sent to your provider as well.   To learn more about what you can do with MyChart, go to [email or web address].    Your next appointment:   3 month(s)  The format for your next appointment:   In Person  Provider:   Oswaldo Milian, MD

## 2019-07-07 ENCOUNTER — Ambulatory Visit (INDEPENDENT_AMBULATORY_CARE_PROVIDER_SITE_OTHER): Payer: Medicaid Other

## 2019-07-07 ENCOUNTER — Other Ambulatory Visit: Payer: Self-pay

## 2019-07-07 DIAGNOSIS — R9431 Abnormal electrocardiogram [ECG] [EKG]: Secondary | ICD-10-CM

## 2019-07-07 DIAGNOSIS — G7111 Myotonic muscular dystrophy: Secondary | ICD-10-CM

## 2019-08-13 ENCOUNTER — Telehealth: Payer: Self-pay | Admitting: Cardiology

## 2019-08-13 NOTE — Telephone Encounter (Signed)
I attempted to contact patient on 08/13/19 to schedule follow up visit with Dr. Gardiner Rhyme from patients recall list. The patient didn't answer so I left message for patient to return call to get that appointment scheduled.

## 2019-09-10 ENCOUNTER — Telehealth (INDEPENDENT_AMBULATORY_CARE_PROVIDER_SITE_OTHER): Payer: Medicaid Other | Admitting: Cardiology

## 2019-09-10 ENCOUNTER — Telehealth: Payer: Self-pay

## 2019-09-10 ENCOUNTER — Encounter: Payer: Self-pay | Admitting: Cardiology

## 2019-09-10 DIAGNOSIS — G7111 Myotonic muscular dystrophy: Secondary | ICD-10-CM

## 2019-09-10 DIAGNOSIS — R9431 Abnormal electrocardiogram [ECG] [EKG]: Secondary | ICD-10-CM

## 2019-09-10 NOTE — Patient Instructions (Signed)
Medication Instructions:  Kerin Ransom, PA-C,  recommends that you continue on your current medications as directed. Please refer to the Current Medication list given to you today.  *If you need a refill on your cardiac medications before your next appointment, please call your pharmacy*   Follow-Up: At [facility], you and your health needs are our priority.  As part of our continuing mission to provide you with exceptional heart care, we have created designated Provider Care Teams.  These Care Teams include your primary Cardiologist (physician) and Advanced Practice Providers (APPs -  Physician Assistants and Nurse Practitioners) who all work together to provide you with the care you need, when you need it.  We recommend signing up for the patient portal called "MyChart".  Sign up information is provided on this After Visit Summary.  MyChart is used to connect with patients for Virtual Visits (Telemedicine).  Patients are able to view lab/test results, encounter notes, upcoming appointments, etc.  Non-urgent messages can be sent to your provider as well.   To learn more about what you can do with MyChart, go to [email or web address].    Your next appointment:   6 month(s)  The format for your next appointment:   In Person  Provider:   You may see Donato Heinz, MD or one of the following Advanced Practice Providers on your designated Care Team:    Rosaria Ferries, PA-C  Jory Sims, DNP, ANP  Cadence Kathlen Mody, PA-C

## 2019-09-10 NOTE — Progress Notes (Signed)
Virtual Visit via Video Note   This visit type was conducted due to national recommendations for restrictions regarding the COVID-19 Pandemic (e.g. social distancing) in an effort to limit this patient's exposure and mitigate transmission in our community.  Due to his co-morbid illnesses, this patient is at least at moderate risk for complications without adequate follow up.  This format is felt to be most appropriate for this patient at this time.  All issues noted in this document were discussed and addressed.  A limited physical exam was performed with this format.  Please refer to the patient's chart for his consent to telehealth for [facility].   The patient was identified using 2 identifiers.  Date:  09/10/2019   ID:  Peter Steele, DOB 09-Aug-1970, MRN [id number]  Patient Location: Home Provider Location: Home Office  PCP:  Matilde Haymaker, MD  Cardiologist:  Dr Gardiner Rhyme  Electrophysiologist:  None   Evaluation Performed:  Follow-Up Visit  Chief Complaint:  none  History of Present Illness:    Peter Steele is a 49 y.o. male with muscular dystrophy who was evaluated by Dr. Alda Lea for possible cardiac involvement.  EKG done June 10, 2019 showed normal sinus rhythm with a normal PR interval, heart rate of 60, and a nonspecific interventricular conduction delay.  Echocardiogram showed normal LV function no significant abnormalities.  Patient was contacted today for 28-month follow-up.  The patient continues to do well at home.  He denies any palpitations or tachycardia, syncope or near syncope.  He has had his initial Covid vaccine and plans to get the second dose as well.  The patient does not have symptoms concerning for COVID-19 infection (fever, chills, cough, or new shortness of breath).    Past Medical History:  Diagnosis Date  . 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  CHOLECYSTECTOMY     Can't remember year     No outpatient medications have been marked as taking for the 09/10/19 encounter (Appointment) with Erlene Quan, PA-C.     Allergies:   Doxycycline hyclate   Social History   Tobacco Use  . Smoking status: Former Smoker    Quit date: 06/13/1995    Years since quitting: 24.2  . Smokeless tobacco: Never Used  Vaping Use  . Vaping Use: Never used  Substance Use Topics  . Alcohol use: Not Currently  . Drug use: Not Currently     Family Hx: The patient's family history includes Asthma in his sister; Diabetes in his father; Early death in his mother; Heart disease in his father, mother, and sister; High blood pressure in his father and sister; Hyperlipidemia in his father and sister; Stroke in his mother.  ROS:   Please see the history of present illness.    All other systems reviewed and are negative.   Prior CV studies:   The following studies were reviewed today:  Echo 07/07/2019- IMPRESSIONS    1. Left ventricular ejection fraction, by estimation, is 60 to 65%. The  left ventricle has normal function. The left ventricle has no regional  wall motion abnormalities. Left ventricular diastolic parameters are  indeterminate.  2. Right ventricular systolic function is normal. The right ventricular  size is normal. There is normal pulmonary artery systolic pressure.  3. The mitral valve is normal in structure. No evidence of mitral valve  regurgitation. No evidence of mitral stenosis.  4. The aortic valve is tricuspid. Aortic valve regurgitation is not  visualized. No aortic stenosis is present.   Labs/Other Tests and Data Reviewed:    EKG:  An ECG dated 06/10/2019 was personally reviewed today and demonstrated:  NSR, HR 65, PR WNL, IVCD noted  Recent Labs: 05/21/2019: BUN 15; Creatinine, Ser 0.93; Hemoglobin 16.0; Platelets 221; Potassium 4.9; Sodium 149   Recent Lipid Panel No results found for: CHOL, TRIG, HDL, CHOLHDL,  LDLCALC, LDLDIRECT  Wt Readings from Last 3 Encounters:  06/10/19 162 lb (73.5 kg)  05/21/19 167 lb 4 oz (75.9 kg)  04/16/18 174 lb 6.4 oz (79.1 kg)     Objective:    Vital Signs:  There were no vitals taken for this visit.   VITAL SIGNS:  reviewed  ASSESSMENT & PLAN:    Muscular dystrophy-  Abnormal EKG- Non specific IVCD. Normal echo.  Plan:  F/U in 6 months with Dr Gardiner Rhyme in the office with an EKG.   COVID-19 Education: The signs and symptoms of COVID-19 were discussed with the patient and how to seek care for testing (follow up with PCP or arrange E-visit).  The importance of social distancing was discussed today.  Time:   Today, I have spent 15 minutes with the patient with telehealth technology discussing the above problems.     Medication Adjustments/Labs and Tests Ordered: Current medicines are reviewed at length with the patient today.  Concerns regarding medicines are outlined above.   Tests Ordered: No orders of the defined types were placed in this encounter.   Medication Changes: No orders of the defined types were placed in this encounter.   Follow Up:  In Person with Dr Gardiner Rhyme in 6 months.  Angelena Form, PA-C  09/10/2019 10:38 AM    [facility]

## 2019-09-10 NOTE — Telephone Encounter (Signed)
  Patient Consent for Virtual Visit         Peter Steele has provided verbal consent on 09/10/2019 for a virtual visit (video or telephone).   CONSENT FOR VIRTUAL VISIT FOR:  Peter Steele  By participating in this virtual visit I agree to the following:  I hereby voluntarily request, consent and authorize [facility] and its employed or contracted physicians, physician assistants, nurse practitioners or other licensed health care professionals (the Practitioner), to provide me with telemedicine health care services (the "Services") as deemed necessary by the treating Practitioner. I acknowledge and consent to receive the Services by the Practitioner via telemedicine. I understand that the telemedicine visit will involve communicating with the Practitioner through live audiovisual communication technology and the disclosure of certain medical information by electronic transmission. I acknowledge that I have been given the opportunity to request an in-person assessment or other available alternative prior to the telemedicine visit and am voluntarily participating in the telemedicine visit.  I understand that I have the right to withhold or withdraw my consent to the use of telemedicine in the course of my care at any time, without affecting my right to future care or treatment, and that the Practitioner or I may terminate the telemedicine visit at any time. I understand that I have the right to inspect all information obtained and/or recorded in the course of the telemedicine visit and may receive copies of available information for a reasonable fee.  I understand that some of the potential risks of receiving the Services via telemedicine include:  Marland Kitchen Delay or interruption in medical evaluation due to technological equipment failure or disruption; . Information transmitted may not be sufficient (e.g. poor resolution of images) to allow for appropriate medical decision making by the  Practitioner; and/or  . In rare instances, security protocols could fail, causing a breach of personal health information.  Furthermore, I acknowledge that it is my responsibility to provide information about my medical history, conditions and care that is complete and accurate to the best of my ability. I acknowledge that Practitioner's advice, recommendations, and/or decision may be based on factors not within their control, such as incomplete or inaccurate data provided by me or distortions of diagnostic images or specimens that may result from electronic transmissions. I understand that the practice of medicine is not an exact science and that Practitioner makes no warranties or guarantees regarding treatment outcomes. I acknowledge that a copy of this consent can be made available to me via my patient portal ([place]), or I can request a printed copy by calling the office of [facility].    I understand that my insurance will be billed for this visit.   I have read or had this consent read to me. . I understand the contents of this consent, which adequately explains the benefits and risks of the Services being provided via telemedicine.  . I have been provided ample opportunity to ask questions regarding this consent and the Services and have had my questions answered to my satisfaction. . I give my informed consent for the services to be provided through the use of telemedicine in my medical care

## 2019-11-09 NOTE — Progress Notes (Signed)
[facility] Note  11/10/2019     CHIEF COMPLAINT Patient presents for Retina Evaluation   HISTORY OF PRESENT ILLNESS: Peter Steele is a 49 y.o. male who presents to the clinic today for:   HPI    Retina Evaluation    In both eyes.  This started years ago.  Duration of years.  Context:  distance vision and near vision.  I, the attending physician,  performed the HPI with the patient and updated documentation appropriately.          Comments    No Hx of Ocular surgeries Not currently using gtts Pt states vision is getting worse and has been over the last few years.  Patient denies eye pain or discomfort and denies any new or worsening floaters or fol OU.       Last edited by Bernarda Caffey, MD on 11/10/2019  1:00 PM. (History)    pt has hx of myotonic dystrophy  Referring physician: Thurston Hole, O.D. [facility] [id number] [place],  [place] [address]  HISTORICAL INFORMATION:   Selected notes from the MEDICAL RECORD NUMBER Referred by Dr. Shirley Muscat LEE: [phone] Ocular Hx-  PMH-    CURRENT MEDICATIONS: No current outpatient medications on file. (Ophthalmic Drugs)   No current facility-administered medications for this visit. (Ophthalmic Drugs)   No current outpatient medications on file. (Other)   No current facility-administered medications for this visit. (Other)      REVIEW OF SYSTEMS: ROS    Positive for: Eyes   Negative for: Constitutional, Gastrointestinal, Neurological, Skin, Genitourinary, Musculoskeletal, HENT, Endocrine, Cardiovascular, Respiratory, Psychiatric, Allergic/Imm, Heme/Lymph   Last edited by Doneen Poisson on 11/10/2019 12:31 PM. (History)       ALLERGIES Allergies  Allergen Reactions  . Doxycycline Hyclate     PAST MEDICAL HISTORY Past Medical History:  Diagnosis Date  . 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 CHOLECYSTECTOMY     Can't remember year    FAMILY HISTORY Family History  Problem Relation Age of Onset  . Heart disease Mother   . Stroke Mother   . Early death Mother   . Diabetes Father   . Heart disease Father   . Hyperlipidemia Father   . High blood pressure Father   . Asthma Sister   . Heart disease Sister   . Hyperlipidemia Sister   . High blood pressure Sister     SOCIAL HISTORY Social History   Tobacco Use  . Smoking status: Former Smoker    Quit date: 06/13/1995    Years since quitting: 24.4  . Smokeless tobacco: Never Used  Vaping Use  . Vaping Use: Never used  Substance Use Topics  . Alcohol use: Not Currently  . Drug use: Not Currently         OPHTHALMIC EXAM:  Base Eye Exam    Visual Acuity (Snellen - Linear)      Right Left   Dist cc 20/40 +1 20/30 +1   Dist ph cc 20/30 -1        Tonometry (Tonopen, 12:37 PM)      Right Left   Pressure 14 13       Pupils      Dark Light Shape React APD   Right 3 2 Round Brisk 0   Left 3 2 Round Brisk 0  Visual Fields      Left Right    Full Full       Extraocular Movement      Right Left    Full Full       Neuro/Psych    Oriented x3: Yes   Mood/Affect: Normal       Dilation    Both eyes: 1.0% Mydriacyl, 2.5% Phenylephrine @ 12:37 PM        Slit Lamp and Fundus Exam    Slit Lamp Exam      Right Left   Lids/Lashes Dermatochalasis - upper lid, mild Meibomian gland dysfunction, mild UL Ptosis Dermatochalasis - upper lid, mild Meibomian gland dysfunction, mild UL Ptosis   Conjunctiva/Sclera Trace Injection Trace Injection   Cornea 1-2+ inferior Punctate epithelial erosions Trace Punctate epithelial erosions, trace Debris in tear film   Anterior Chamber Deep and quiet Deep and quiet   Iris Round and dilated Round and dilated   Lens 1-2+ Nuclear sclerosis, 1-2+ Cortical cataract 1-2+ Nuclear sclerosis, 1-2+ Cortical cataract   Vitreous mild  Vitreous syneresis mild Vitreous syneresis       Fundus Exam      Right Left   Disc Pink and Sharp, Compact Pink and Sharp, Compact   C/D Ratio 0.2 0.1   Macula Blunted foveal reflex, focal RPE mottling, clumping and drusen IN macula, No heme or edema Flat, Blunted foveal reflex, focal pigmented choroidal lesion with RPE mottling, clumping and drusen along IT arcades with trace elevation, no SRF or orange pigment, No heme or edema   Vessels Vascular attenuation, Tortuous Mild Vascular attenuation   Periphery Attached, mild RPE mottling, no heme Attached, mild RPE mottling, focal pigmented lesion along IT arcades with trace elevation, no SRF or orange pigment, no heme        Refraction    Manifest Refraction      Sphere Cylinder Dist VA   Right +0.50 Sphere 20/30-2   Left +0.00 Sphere 20/30+1          IMAGING AND PROCEDURES  Imaging and Procedures for 11/10/2019  OCT, Retina - OU - Both Eyes       Right Eye Quality was good. Central Foveal Thickness: 300. Progression has no prior data. Findings include normal foveal contour, no IRF, no SRF, retinal drusen , vitreomacular adhesion , outer retinal atrophy (focal drusen with ellipsoid thinning/ORA central macula).   Left Eye Quality was good. Central Foveal Thickness: 290. Progression has no prior data. Findings include normal foveal contour, no IRF, no SRF, retinal drusen , outer retinal atrophy (focal drusen with ellipsoid thinning/ORA temporal macula).   Notes *Images captured and stored on drive  Diagnosis / Impression:  OD: NFP; no IRF/SRF; focal drusen with ellipsoid thinning/ORA central macula OS: NFP; no IRF/SRF; focal drusen with ellipsoid thinning/ORA temporal macula  Clinical management:  See below  Abbreviations: NFP - Normal foveal profile. CME - cystoid macular edema. PED - pigment epithelial detachment. IRF - intraretinal fluid. SRF - subretinal fluid. EZ - ellipsoid zone. ERM - epiretinal membrane. ORA - outer  retinal atrophy. ORT - outer retinal tubulation. SRHM - subretinal hyper-reflective material. IRHM - intraretinal hyper-reflective material        Fluorescein Angiography Optos (Transit OD)       Right Eye   Progression has no prior data. Early phase findings include staining, vascular perfusion defect (Patchy choroidal filling; vascykar non-perfusion temporal periphery). Mid/Late phase findings include leakage, staining (Late perivascular leakage temporal periphery, mild focal staining  inferior and nasal fovea).   Left Eye   Progression has no prior data. Early phase findings include staining, window defect, vascular perfusion defect. Mid/Late phase findings include staining, window defect, vascular perfusion defect, leakage (Mild vascular perfusion defects peripherally, with mild perivascular leakage peripherally, focal window defect and staining IT macula).   Notes **Images stored on drive**  Impression: Focal non-exu macular degeneration OU OD: mild focal staining inferior and nasal fovea -- no CNV; Late perivascular leakage temporal periphery OS: focal window defect and staining IT macula; mild vascular perfusion defects peripherally with mild perivascular leakage                   ASSESSMENT/PLAN:    ICD-10-CM   1. 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 Retinal edema  H35.81 OCT, Retina - OU - Both Eyes  3. Choroidal nevus, left eye  D31.32   4. Essential hypertension  I10   5. Hypertensive retinopathy of both eyes  H35.033 Fluorescein Angiography Optos (Transit OD)  6. Combined forms of age-related cataract of both eyes  H25.813     1,2. Age related macular degeneration, non-exudative, OD  - focal, atypical nonexudative ARMD OD -- atypical due to age  - exam and OCT show focal drusen and ellipsoid thinning inf nasal fovea  - FA 9.8.21 shows focal staining inf nasal fovea -- no central leakage or CNV  - The  incidence, anatomy, and pathology of dry AMD, risk of progression, and the AREDS and AREDS 2 studies including smoking risks discussed with patient.  - Recommend amsler grid monitoring  - f/u 4 months, DFE, OCT  3. Choroidal Nevus, OS  - pigmented lesion along IT arcades  - no visual symptoms, SRF or orange pigment  - +drusen  - thickness < 37mm -- minimal elevation  - discussed findings, prognosis  - recommend monitoring  - f/u 4 months  4,5. Hypertensive retinopathy OU - discussed importance of tight BP control - monitor  6. Mixed Cataract OU - The symptoms of cataract, surgical options, and treatments and risks were discussed with patient. - discussed diagnosis and progression - not yet visually significant - monitor for now   Ophthalmic Meds Ordered this visit:  No orders of the defined types were placed in this encounter.      Return in about 4 months (around 03/11/2020) for f/u non-exu ARMD OD, DFE, OCT.  There are no Patient Instructions on file for this visit.   Explained the diagnoses, plan, and follow up with the patient and they expressed understanding.  Patient expressed understanding of the importance of proper follow up care.   This document serves as a record of services personally performed by Gardiner Sleeper, MD, PhD. It was created on their behalf by Roselee Nova, COMT. The creation of this record is the provider's dictation and/or activities during the visit.  Electronically signed by: Roselee Nova, COMT 11/10/19 4:31 PM  Gardiner Sleeper, M.D., Ph.D. Diseases & Surgery of the Retina and Vitreous [facility]  I have reviewed the above documentation for accuracy and completeness, and I agree with the above. Gardiner Sleeper, M.D., Ph.D. 11/10/19 4:31 PM    Abbreviations: M myopia (nearsighted); A astigmatism; H hyperopia (farsighted); P presbyopia; Mrx spectacle prescription;  CTL contact lenses; OD right eye; OS left eye; OU both  eyes  XT exotropia; ET esotropia; PEK punctate epithelial keratitis; PEE punctate epithelial erosions; DES  dry eye syndrome; MGD meibomian gland dysfunction; ATs artificial tears; PFAT's preservative free artificial tears; [place] nuclear sclerotic cataract; PSC posterior subcapsular cataract; ERM epi-retinal membrane; PVD posterior vitreous detachment; RD retinal detachment; DM diabetes mellitus; DR diabetic retinopathy; NPDR non-proliferative diabetic retinopathy; PDR proliferative diabetic retinopathy; CSME clinically significant macular edema; DME diabetic macular edema; dbh dot blot hemorrhages; CWS cotton wool spot; POAG primary open angle glaucoma; C/D cup-to-disc ratio; HVF humphrey visual field; GVF goldmann visual field; OCT optical coherence tomography; IOP intraocular pressure; BRVO Branch retinal vein occlusion; CRVO central retinal vein occlusion; CRAO central retinal artery occlusion; BRAO branch retinal artery occlusion; RT retinal tear; SB scleral buckle; PPV pars plana vitrectomy; VH Vitreous hemorrhage; PRP panretinal laser photocoagulation; IVK intravitreal kenalog; VMT vitreomacular traction; MH Macular hole;  NVD neovascularization of the disc; NVE neovascularization elsewhere; AREDS age related eye disease study; ARMD age related macular degeneration; POAG primary open angle glaucoma; EBMD epithelial/anterior basement membrane dystrophy; ACIOL anterior chamber intraocular lens; IOL intraocular lens; PCIOL posterior chamber intraocular lens; Phaco/IOL phacoemulsification with intraocular lens placement; [place] photorefractive keratectomy; LASIK laser assisted in situ keratomileusis; HTN hypertension; DM diabetes mellitus; COPD chronic obstructive pulmonary disease

## 2019-11-10 ENCOUNTER — Other Ambulatory Visit: Payer: Self-pay

## 2019-11-10 ENCOUNTER — Encounter (INDEPENDENT_AMBULATORY_CARE_PROVIDER_SITE_OTHER): Payer: Self-pay | Admitting: Ophthalmology

## 2019-11-10 ENCOUNTER — Ambulatory Visit (INDEPENDENT_AMBULATORY_CARE_PROVIDER_SITE_OTHER): Payer: Medicaid Other | Admitting: Ophthalmology

## 2019-11-10 DIAGNOSIS — I10 Essential (primary) hypertension: Secondary | ICD-10-CM | POA: Diagnosis not present

## 2019-11-10 DIAGNOSIS — H353111 Nonexudative age-related macular degeneration, right eye, early dry stage: Secondary | ICD-10-CM | POA: Diagnosis not present

## 2019-11-10 DIAGNOSIS — H3581 Retinal edema: Secondary | ICD-10-CM | POA: Diagnosis not present

## 2019-11-10 DIAGNOSIS — H35033 Hypertensive retinopathy, bilateral: Secondary | ICD-10-CM | POA: Diagnosis not present

## 2019-11-10 DIAGNOSIS — D3132 Benign neoplasm of left choroid: Secondary | ICD-10-CM

## 2019-11-10 DIAGNOSIS — H25813 Combined forms of age-related cataract, bilateral: Secondary | ICD-10-CM

## 2020-03-05 NOTE — Progress Notes (Deleted)
Cardiology Office Note:    Date:  03/05/2020   ID:  Peter Steele, DOB 12-24-1970, MRN [id number]  PCP:  Peter Mo, MD  Cardiologist:  Little Ishikawa, MD  Electrophysiologist:  None   Referring MD: Peter Mo, MD   No chief complaint on file.   History of Present Illness:    Peter Steele is a 50 y.o. male with a hx of myotonic muscular dystrophy who is referred by Dr. Deirdre Steele for evaluation of cardiac involvement of his muscular dystrophy.  [name] reports that he has been doing well.  He denies any chest pain, dyspnea, lightheadedness, syncope, or palpitations.  He does exercises at home gym, denies any symptoms.  Echocardiogram on 07/07/2019 showed normal biventricular function, no significant valvular disease.   Past Medical History:  Diagnosis Date  . Myotonic muscular dystrophy (HCC)    Followed by [facility]rology for this    Past Surgical History:  Procedure Laterality Date  . CHOLECYSTECTOMY     Can't remember year    Current Medications: No outpatient medications have been marked as taking for the 03/06/20 encounter (Appointment) with Little Ishikawa, MD.     Allergies:   Doxycycline hyclate   Social History   Socioeconomic History  . Marital status: Single    Spouse name: Not on file  . Number of children: Not on file  . Years of education: Not on file  . Highest education level: Not on file  Occupational History  . Occupation: not employed  Tobacco Use  . Smoking status: Former Smoker    Quit date: 06/13/1995    Years since quitting: 24.7  . Smokeless tobacco: Never Used  Vaping Use  . Vaping Use: Never used  Substance and Sexual Activity  . Alcohol use: Not Currently  . Drug use: Not Currently  . Sexual activity: Not Currently    Comment: Girlfriend died 5 years ago. not active since  Other Topics Concern  . Not on file  Social History Narrative  . Not on file   Social Determinants of Health    Financial Resource Strain: Not on file  Food Insecurity: Not on file  Transportation Needs: Not on file  Physical Activity: Not on file  Stress: Not on file  Social Connections: Not on file     Family History: The patient's family history includes Asthma in his sister; Diabetes in his father; Early death in his mother; Heart disease in his father, mother, and sister; High blood pressure in his father and sister; Hyperlipidemia in his father and sister; Stroke in his mother.  ROS:   Please see the history of present illness.     All other systems reviewed and are negative.  EKGs/Labs/Other Studies Reviewed:    The following studies were reviewed today:   EKG:  EKG is ordered today.  The ekg ordered today demonstrates normal sinus rhythm, rate 65, early repolarization, nonspecific interventricular conduction delay  Recent Labs: 05/21/2019: BUN 15; Creatinine, Ser 0.93; Hemoglobin 16.0; Platelets 221; Potassium 4.9; Sodium 149  Recent Lipid Panel No results found for: CHOL, TRIG, HDL, CHOLHDL, VLDL, LDLCALC, LDLDIRECT  Physical Exam:    VS:  There were no vitals taken for this visit.    Wt Readings from Last 3 Encounters:  06/10/19 162 lb (73.5 kg)  05/21/19 167 lb 4 oz (75.9 kg)  04/16/18 174 lb 6.4 oz (79.1 kg)     GEN:  in no acute distress HEENT: Normal NECK: No JVD LYMPHATICS:  No lymphadenopathy CARDIAC: RRR, no murmurs, rubs, gallops RESPIRATORY:  Clear to auscultation without rales, wheezing or rhonchi  ABDOMEN: Soft, non-tender, non-distended  MUSCULOSKELETAL:  No edema SKIN: Warm and dry NEUROLOGIC:  Alert and oriented x 3 PSYCHIATRIC:  Normal affect   ASSESSMENT:    No diagnosis found. PLAN:    Myotonic muscular dystrophy: Has been associated with increased risk of cardiomyopathy, conduction disorders, and arrhythmias.  He is currently asymptomatic.  EKG with nonspecific intraventricular conduction delay.  Echocardiogram on 07/07/2019 showed normal  biventricular function, no significant valvular disease.  RTC in ***  Medication Adjustments/Labs and Tests Ordered: Current medicines are reviewed at length with the patient today.  Concerns regarding medicines are outlined above.  No orders of the defined types were placed in this encounter.  No orders of the defined types were placed in this encounter.   There are no Patient Instructions on file for this visit.   Signed, Donato Heinz, MD  03/05/2020 4:32 PM    [facility]

## 2020-03-06 ENCOUNTER — Ambulatory Visit: Payer: Medicaid Other | Admitting: Cardiology

## 2020-03-10 NOTE — Progress Notes (Signed)
[facility] Note  03/14/2020     CHIEF COMPLAINT Patient presents for Retina Follow Up   HISTORY OF PRESENT ILLNESS: Peter Steele is a 50 y.o. male who presents to the clinic today for:   HPI    Retina Follow Up    Patient presents with  Dry AMD.  In right eye.  This started weeks ago.  Severity is moderate.  Duration of weeks.  Since onset it is stable.  I, the attending physician,  performed the HPI with the patient and updated documentation appropriately.          Comments    Pt states vision is the same OU.  Pt denies eye pain or discomfort--states corner of OS gets red and swollen from time to time (nasal corner).  Patient denies any new or worsening floaters or fol OU.       Last edited by Bernarda Caffey, MD on 03/14/2020 12:48 PM. (History)    pt states his vision seems to be doing okay  Referring physician: Thurston Hole, O.D. [facility] [address] [place],  [place] [address]  HISTORICAL INFORMATION:   Selected notes from the MEDICAL RECORD NUMBER Referred by Dr. Shirley Muscat   CURRENT MEDICATIONS: No current outpatient medications on file. (Ophthalmic Drugs)   No current facility-administered medications for this visit. (Ophthalmic Drugs)   No current outpatient medications on file. (Other)   No current facility-administered medications for this visit. (Other)      REVIEW OF SYSTEMS: ROS    Positive for: Eyes   Negative for: Constitutional, Gastrointestinal, Neurological, Skin, Genitourinary, Musculoskeletal, HENT, Endocrine, Cardiovascular, Respiratory, Psychiatric, Allergic/Imm, Heme/Lymph   Last edited by Doneen Poisson on 03/14/2020 12:36 PM. (History)       ALLERGIES Allergies  Allergen Reactions   Doxycycline Hyclate     PAST MEDICAL HISTORY 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[place])    Followed by [facility] Neurology for this   Past Surgical  History:  Procedure Laterality Date   CHOLECYSTECTOMY     Can't remember year    FAMILY HISTORY Family History  Problem Relation Age of Onset   Heart disease Mother    Stroke Mother    Early death Mother    Diabetes Father    Heart disease Father    Hyperlipidemia Father    High blood pressure Father    Asthma Sister    Heart disease Sister    Hyperlipidemia Sister    High blood pressure Sister     SOCIAL HISTORY Social History   Tobacco Use   Smoking status: Former Smoker    Quit date: 06/13/1995    Years since quitting: 24.7   Smokeless tobacco: Never Used  Vaping Use   Vaping Use: Never used  Substance Use Topics   Alcohol use: Not Currently   Drug use: Not Currently         OPHTHALMIC EXAM:  Base Eye Exam    Visual Acuity (Snellen - Linear)      Right Left   Dist cc 20/30 -2 20/30 -2   Dist ph cc NI NI   Correction: Glasses       Tonometry (Tonopen, 12:36 PM)      Right Left   Pressure 9 10       Pupils      Dark Light Shape React APD   Right 3 2 Round Brisk 0  Left 3 2 Round Brisk 0       Visual Fields      Left Right    Full Full       Extraocular Movement      Right Left    Full Full       Neuro/Psych    Oriented x3: Yes   Mood/Affect: Normal       Dilation    Both eyes: 1.0% Mydriacyl, 2.5% Phenylephrine @ 12:36 PM        Slit Lamp and Fundus Exam    Slit Lamp Exam      Right Left   Lids/Lashes Dermatochalasis - upper lid, mild Meibomian gland dysfunction, mild UL Ptosis Dermatochalasis - upper lid, mild Meibomian gland dysfunction, mild UL Ptosis   Conjunctiva/Sclera White and quiet White and quiet   Cornea 2+ inferior Punctate epithelial erosions, mild tear film debris Trace Punctate epithelial erosions, trace Debris in tear film   Anterior Chamber Deep and quiet Deep and quiet   Iris Round and dilated Round and dilated   Lens 2+ Nuclear sclerosis, 2+ Cortical cataract 2+ Nuclear sclerosis, 2+ Cortical  cataract   Vitreous mild Vitreous syneresis mild Vitreous syneresis       Fundus Exam      Right Left   Disc Pink and Sharp, Compact Pink and Sharp, Compact   C/D Ratio 0.2 0.1   Macula Blunted foveal reflex, focal RPE mottling, clumping and drusen IN macula -- stable from prior, No heme or edema Flat, Blunted foveal reflex, focal pigmented choroidal lesion with RPE mottling, clumping and drusen along IT arcades with trace elevation, no SRF or orange pigment, No heme or edema   Vessels Vascular attenuation, Tortuous Mild Vascular attenuation, mild tortuousity   Periphery Attached, mild RPE mottling, no heme Attached, mild RPE mottling, focal pigmented lesion along IT arcades with trace elevation, no SRF or orange pigment, no heme          IMAGING AND PROCEDURES  Imaging and Procedures for 03/14/2020  OCT, Retina - OU - Both Eyes       Right Eye Quality was good. Central Foveal Thickness: 295. Progression has been stable. Findings include normal foveal contour, no IRF, no SRF, retinal drusen , vitreomacular adhesion , outer retinal atrophy (focal drusen with ellipsoid thinning/ORA central macula).   Left Eye Quality was good. Central Foveal Thickness: 284. Progression has been stable. Findings include normal foveal contour, no IRF, no SRF, retinal drusen , outer retinal atrophy (focal drusen with ellipsoid thinning/ORA temporal macula; hyper reflective choroidal lesion IT macula - stable from prior).   Notes *Images captured and stored on drive  Diagnosis / Impression:  OD: NFP; no IRF/SRF; focal drusen with ellipsoid thinning/ORA central macula OS: NFP; no IRF/SRF; focal drusen with ellipsoid thinning/ORA temporal macula; hyper reflective choroidal lesion IT macula - stable from prior  Clinical management:  See below  Abbreviations: NFP - Normal foveal profile. CME - cystoid macular edema. PED - pigment epithelial detachment. IRF - intraretinal fluid. SRF - subretinal fluid. EZ  - ellipsoid zone. ERM - epiretinal membrane. ORA - outer retinal atrophy. ORT - outer retinal tubulation. SRHM - subretinal hyper-reflective material. IRHM - intraretinal hyper-reflective material                 ASSESSMENT/PLAN:    ICD-10-CM   1. 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 Retinal edema  H35.81 OCT, Retina - OU -  Both Eyes  3. Choroidal nevus, left eye  D31.32   4. Essential hypertension  I10   5. Hypertensive retinopathy of both eyes  H35.033   6. Combined forms of age-related cataract of both eyes  H25.813     1,2. Age related macular degeneration, non-exudative, OD  - focal, atypical nonexudative ARMD OD -- atypical due to age  - exam and OCT show focal drusen and ellipsoid thinning inf nasal fovea  - FA (09.08.21) shows focal staining inf nasal fovea -- no central leakage or CNV  - The incidence, anatomy, and pathology of dry AMD, risk of progression, and the AREDS and AREDS 2 studies including smoking risks discussed with patient.  - Recommend amsler grid monitoring  - f/u 6-9 months, DFE, OCT  3. Choroidal Nevus, OS  - pigmented lesion along IT arcades  - no visual symptoms, SRF or orange pigment  - +drusen  - thickness < 28mm -- minimal elevation  - discussed findings, prognosis  - recommend monitoring  - f/u 6-9 months  4,5. Hypertensive retinopathy OU - discussed importance of tight BP control - monitor  6. Mixed Cataract OU - The symptoms of cataract, surgical options, and treatments and risks were discussed with patient. - discussed diagnosis and progression - not yet visually significant - monitor for now  Ophthalmic Meds Ordered this visit:  No orders of the defined types were placed in this encounter.     Return for f/u 6-9 months, non-exu ARMD OD, DFE, OCT.  There are no Patient Instructions on file for this visit.  Explained the diagnoses, plan, and follow up with the patient and  they expressed understanding.  Patient expressed understanding of the importance of proper follow up care.   This document serves as a record of services personally performed by Gardiner Sleeper, MD, PhD. It was created on their behalf by San Jetty. Owens Shark, OA an ophthalmic technician. The creation of this record is the provider's dictation and/or activities during the visit.    Electronically signed by: San Jetty. Owens Shark, [place] 01.11.2022 1:13 PM  Gardiner Sleeper, M.D., Ph.D. Diseases & Surgery of the Retina and [place] 03/14/2020   I have reviewed the above documentation for accuracy and completeness, and I agree with the above. Gardiner Sleeper, M.D., Ph.D. 03/14/20 1:13 PM   Abbreviations: M myopia (nearsighted); A astigmatism; H hyperopia (farsighted); P presbyopia; Mrx spectacle prescription;  CTL contact lenses; OD right eye; OS left eye; OU both eyes  XT exotropia; ET esotropia; PEK punctate epithelial keratitis; PEE punctate epithelial erosions; DES dry eye syndrome; MGD meibomian gland dysfunction; ATs artificial tears; PFAT's preservative free artificial tears; [place] nuclear sclerotic cataract; PSC posterior subcapsular cataract; ERM epi-retinal membrane; PVD posterior vitreous detachment; RD retinal detachment; DM diabetes mellitus; DR diabetic retinopathy; NPDR non-proliferative diabetic retinopathy; PDR proliferative diabetic retinopathy; CSME clinically significant macular edema; DME diabetic macular edema; dbh dot blot hemorrhages; CWS cotton wool spot; POAG primary open angle glaucoma; C/D cup-to-disc ratio; HVF humphrey visual field; GVF goldmann visual field; OCT optical coherence tomography; IOP intraocular pressure; BRVO Branch retinal vein occlusion; CRVO central retinal vein occlusion; CRAO central retinal artery occlusion; BRAO branch retinal artery occlusion; RT retinal tear; SB scleral buckle; PPV pars plana vitrectomy; VH Vitreous hemorrhage; PRP  panretinal laser photocoagulation; IVK intravitreal kenalog; VMT vitreomacular traction; MH Macular hole;  NVD neovascularization of the disc; NVE neovascularization elsewhere; AREDS age related eye disease study; ARMD age related macular degeneration;  POAG primary open angle glaucoma; EBMD epithelial/anterior basement membrane dystrophy; ACIOL anterior chamber intraocular lens; IOL intraocular lens; PCIOL posterior chamber intraocular lens; Phaco/IOL phacoemulsification with intraocular lens placement; [place] photorefractive keratectomy; LASIK laser assisted in situ keratomileusis; HTN hypertension; DM diabetes mellitus; COPD chronic obstructive pulmonary disease

## 2020-03-14 ENCOUNTER — Other Ambulatory Visit: Payer: Self-pay

## 2020-03-14 ENCOUNTER — Encounter (INDEPENDENT_AMBULATORY_CARE_PROVIDER_SITE_OTHER): Payer: Self-pay | Admitting: Ophthalmology

## 2020-03-14 ENCOUNTER — Ambulatory Visit (INDEPENDENT_AMBULATORY_CARE_PROVIDER_SITE_OTHER): Payer: Medicaid Other | Admitting: Ophthalmology

## 2020-03-14 DIAGNOSIS — H35033 Hypertensive retinopathy, bilateral: Secondary | ICD-10-CM

## 2020-03-14 DIAGNOSIS — D3132 Benign neoplasm of left choroid: Secondary | ICD-10-CM

## 2020-03-14 DIAGNOSIS — I10 Essential (primary) hypertension: Secondary | ICD-10-CM

## 2020-03-14 DIAGNOSIS — H3581 Retinal edema: Secondary | ICD-10-CM | POA: Diagnosis not present

## 2020-03-14 DIAGNOSIS — H353111 Nonexudative age-related macular degeneration, right eye, early dry stage: Secondary | ICD-10-CM

## 2020-03-14 DIAGNOSIS — H25813 Combined forms of age-related cataract, bilateral: Secondary | ICD-10-CM

## 2020-03-17 ENCOUNTER — Telehealth (INDEPENDENT_AMBULATORY_CARE_PROVIDER_SITE_OTHER): Payer: Medicaid Other | Admitting: Cardiology

## 2020-03-17 ENCOUNTER — Encounter: Payer: Self-pay | Admitting: Cardiology

## 2020-03-17 VITALS — BP 121/84 | HR 76 | Ht 65.0 in | Wt 160.0 lb

## 2020-03-17 DIAGNOSIS — G7111 Myotonic muscular dystrophy: Secondary | ICD-10-CM

## 2020-03-17 NOTE — Progress Notes (Signed)
Virtual Visit via Video Note   This visit type was conducted due to national recommendations for restrictions regarding the COVID-19 Pandemic (e.g. social distancing) in an effort to limit this patient's exposure and mitigate transmission in our community.  Due to his co-morbid illnesses, this patient is at least at moderate risk for complications without adequate follow up.  This format is felt to be most appropriate for this patient at this time.  All issues noted in this document were discussed and addressed.  A limited physical exam was performed with this format.  Please refer to the patient's chart for his consent to telehealth for [facility].       Date:  03/17/2020   ID:  Peter Steele, DOB 03/19/1970, MRN [id number]  Patient Location: Home Provider Location: Home Office  PCP:  Matilde Haymaker, MD  Cardiologist:  Donato Heinz, MD  Electrophysiologist:  None   Evaluation Performed:  Follow-Up Visit  Chief Complaint: Myotonic muscular dystrophy  History of Present Illness:    Peter Steele is a 50 y.o. male with a hx of myotonic muscular dystrophy who presents for follow-up.  He was referred by Dr. Erin Hearing for evaluation of cardiac involvement of his muscular dystrophy.  Mr. Peyton reports that he has been doing well.  He denies any chest pain, dyspnea, lightheadedness, syncope, or palpitations.  He does exercises at home gym, denies any symptoms.  Echocardiogram on 07/07/2019 showed normal biventricular function, no significant valvular disease.  Since last clinic visit, he reports that he has been doing well.  He denies any chest pain, dyspnea, headedness, syncope, palpitations, or lower extremity edema.  Reports for exercise he walks his dog multiple times per day, for about 15 to 20 minutes each time.  Typically will walk his dog 4-6 times per day.  Denies any exertional symptoms.  Past Medical History:  Diagnosis Date  . Myotonic muscular  dystrophy ([place])    Followed by [facility]rology for this   Past Surgical History:  Procedure Laterality Date  . CHOLECYSTECTOMY     Can't remember year     No outpatient medications have been marked as taking for the 03/17/20 encounter (Video Visit) with Donato Heinz, MD.     Allergies:   Bee venom, Doxycycline hyclate, and Vinegar [acetic acid]   Social History   Tobacco Use  . Smoking status: Former Smoker    Quit date: 06/13/1995    Years since quitting: 24.7  . Smokeless tobacco: Never Used  Vaping Use  . Vaping Use: Never used  Substance Use Topics  . Alcohol use: Not Currently  . Drug use: Not Currently     Family Hx: The patient's family history includes Asthma in his sister; Diabetes in his father; Early death in his mother; Heart disease in his father, mother, and sister; High blood pressure in his father and sister; Hyperlipidemia in his father and sister; Stroke in his mother.  ROS:   Please see the history of present illness.     All other systems reviewed and are negative.   Prior CV studies:   The following studies were reviewed today:   Labs/Other Tests and Data Reviewed:    EKG:  No ECG reviewed.  Recent Labs: 05/21/2019: BUN 15; Creatinine, Ser 0.93; Hemoglobin 16.0; Platelets 221; Potassium 4.9; Sodium 149   Recent Lipid Panel No results found for: CHOL, TRIG, HDL, CHOLHDL, LDLCALC, LDLDIRECT  Wt Readings from Last 3 Encounters:  03/17/20 160 lb (72.6 kg)  06/10/19 162 lb (73.5 kg)  05/21/19 167 lb 4 oz (75.9 kg)     Objective:    Vital Signs:  BP 121/84   Pulse 76   Ht 5\' 5"  (1.651 m)   Wt 160 lb (72.6 kg)   BMI 26.63 kg/m    VITAL SIGNS:  reviewed GEN:  no acute distress  ASSESSMENT & PLAN:    Myotonic muscular dystrophy: Has been associated with increased risk of cardiomyopathy, conduction disorders, and arrhythmias.  He is currently asymptomatic.  EKG with nonspecific intraventricular conduction delay.  Echocardiogram  on 07/07/2019 showed normal biventricular function, no significant valvular disease.  No further cardiac work-up recommended at this time.   RTC in 6 months  Time:   Today, I have spent 5 minutes with the patient with telehealth technology discussing the above problems.     Medication Adjustments/Labs and Tests Ordered: Current medicines are reviewed at length with the patient today.  Concerns regarding medicines are outlined above.   Tests Ordered: No orders of the defined types were placed in this encounter.   Medication Changes: No orders of the defined types were placed in this encounter.   Follow Up:  In Person in 6 month(s)  Signed, Donato Heinz, MD  03/17/2020 3:53 PM    [place]

## 2020-03-17 NOTE — Patient Instructions (Addendum)
Medication Instructions:  Your physician recommends that you continue on your current medications as directed. Please refer to the Current Medication list given to you today.  Follow-Up: At [facility], you and your health needs are our priority.  As part of our continuing mission to provide you with exceptional heart care, we have created designated Provider Care Teams.  These Care Teams include your primary Cardiologist (physician) and Advanced Practice Providers (APPs -  Physician Assistants and Nurse Practitioners) who all work together to provide you with the care you need, when you need it.  We recommend signing up for the patient portal called "MyChart".  Sign up information is provided on this After Visit Summary.  MyChart is used to connect with patients for Virtual Visits (Telemedicine).  Patients are able to view lab/test results, encounter notes, upcoming appointments, etc.  Non-urgent messages can be sent to your provider as well.   To learn more about what you can do with MyChart, go to [email or web address]://[email or web address].com.    Your next appointment:   6 month(s)  The format for your next appointment:   In Person  Provider:   Christopher Schumann, MD     

## 2020-03-31 DIAGNOSIS — Z20822 Contact with and (suspected) exposure to covid-19: Secondary | ICD-10-CM | POA: Diagnosis not present

## 2020-03-31 DIAGNOSIS — U071 COVID-19: Secondary | ICD-10-CM | POA: Diagnosis not present

## 2020-06-13 DIAGNOSIS — G7111 Myotonic muscular dystrophy: Secondary | ICD-10-CM | POA: Diagnosis not present

## 2020-06-14 ENCOUNTER — Other Ambulatory Visit: Payer: Self-pay

## 2020-06-14 ENCOUNTER — Encounter: Payer: Self-pay | Admitting: Family Medicine

## 2020-06-14 ENCOUNTER — Ambulatory Visit: Payer: Medicaid Other | Admitting: Family Medicine

## 2020-06-14 VITALS — BP 120/81 | HR 78 | Ht 65.0 in | Wt 166.5 lb

## 2020-06-14 DIAGNOSIS — Z114 Encounter for screening for human immunodeficiency virus [HIV]: Secondary | ICD-10-CM | POA: Diagnosis not present

## 2020-06-14 DIAGNOSIS — R748 Abnormal levels of other serum enzymes: Secondary | ICD-10-CM | POA: Diagnosis not present

## 2020-06-14 DIAGNOSIS — Z1211 Encounter for screening for malignant neoplasm of colon: Secondary | ICD-10-CM | POA: Diagnosis not present

## 2020-06-14 DIAGNOSIS — R94120 Abnormal auditory function study: Secondary | ICD-10-CM | POA: Diagnosis not present

## 2020-06-14 DIAGNOSIS — G7111 Myotonic muscular dystrophy: Secondary | ICD-10-CM

## 2020-06-14 DIAGNOSIS — D234 Other benign neoplasm of skin of scalp and neck: Secondary | ICD-10-CM

## 2020-06-14 DIAGNOSIS — Z13220 Encounter for screening for lipoid disorders: Secondary | ICD-10-CM

## 2020-06-14 DIAGNOSIS — Z1159 Encounter for screening for other viral diseases: Secondary | ICD-10-CM | POA: Diagnosis not present

## 2020-06-14 NOTE — Assessment & Plan Note (Addendum)
He would like this nevus in his right parietal area to be removed because it interferes with his hats and he finds it unsightly.  Discussed with Dr. Erin Hearing. -Scheduled with follow-up in the [facility] dermatology clinic

## 2020-06-14 NOTE — Assessment & Plan Note (Signed)
-  Placed referral to audiology

## 2020-06-14 NOTE — Patient Instructions (Signed)
It was great to see you today.  Here is a quick review of the things we talked about:  Screening for colon cancer:I have placed a referral to the GI doctor who performed the colonoscopy. you should get a call in the next 1-2 weeks to set up your appointment.  Please let me know if you have not received a call in the next 2 weeks.  Audiology: I have placed a referral to audiology as well.  You should get a call to set up an appointment in the next 1-2 weeks.  If all of your labs are normal, I will send you a message over my chart or send you a letter.  If there is anything to discuss, I will give you a phone call.

## 2020-06-14 NOTE — Assessment & Plan Note (Signed)
Follow up CMP

## 2020-06-14 NOTE — Assessment & Plan Note (Signed)
No changes at this time.  He does not have any evidence of progression of debility or muscular symptoms. -Follow-up as needed with cardiology, neurology and ophthalmology

## 2020-06-14 NOTE — Progress Notes (Signed)
    SUBJECTIVE:   CHIEF COMPLAINT / HPI:   Myotonic muscular dystrophy He has recently seen the following providers for routine care for his muscular dystrophy: -Cardiology -Neurology -Ophthalmology He does not require any specific treatment from these providers at this time.  Abnormal hearing exam He is in clinic today with his brother-in-law who provided some medical background.  Seems to have had trouble with hearing for some years now and would like a referral to audiology for hearing aids.  Smoking history: Roughly a 1.5-pack-year history of smoking.  Not currently smoking.  Alcohol use: Several drinks per month  Illicit drug use: Denies  PHQ-9: No significant symptoms of depression.  PERTINENT  PMH / PSH: Myotonic muscular dystrophy  OBJECTIVE:   BP 120/81   Pulse 78   Ht 5\' 5"  (1.651 m)   Wt 166 lb 8 oz (75.5 kg)   SpO2 96%   BMI 27.71 kg/m    General: Alert and cooperative and appears to be in no acute distress HEENT: He notes a quarter sized flesh-colored coliform growth over his right parietal area.  Nontender to palpation.  No evidence of bleeding or ulceration. Cardio: Normal S1 and S2, no S3 or S4. Rhythm is regular. No murmurs or rubs.   Pulm: Clear to auscultation bilaterally, no crackles, wheezing, or diminished breath sounds. Normal respiratory effort Abdomen: Bowel sounds normal. Abdomen soft and non-tender.  Extremities: No peripheral edema. Warm/ well perfused.  Strong radial pulses. Neuro: Cranial nerves grossly intact  ASSESSMENT/PLAN:   Benign neoplasm of skin He would like this nevus in his right parietal area to be removed because it interferes with his hats and he finds it unsightly.  Discussed with Dr. Erin Hearing. -Scheduled with follow-up in the [facility] dermat[facility] clinic  Myotonic muscular dystrophy ([place]) No changes at this time.  He does not have any evidence of progression of debility or muscular symptoms. -Follow-up as needed with  cardiology, neurology and ophthalmology  Abnormal hearing screen -Placed referral to audiology  Elevated liver enzymes -Follow-up CMP   Health maintenance -Placed referral for colonoscopy -Follow-up HIV, hepatitis C, lipid panel  Matilde Haymaker, MD [place]

## 2020-06-15 LAB — COMPREHENSIVE METABOLIC PANEL
ALT: 29 IU/L (ref 0–44)
AST: 22 IU/L (ref 0–40)
Albumin/Globulin Ratio: 2.2 (ref 1.2–2.2)
Albumin: 4.6 g/dL (ref 4.0–5.0)
Alkaline Phosphatase: 96 IU/L (ref 44–121)
BUN/Creatinine Ratio: 18 (ref 9–20)
BUN: 15 mg/dL (ref 6–24)
Bilirubin Total: 0.7 mg/dL (ref 0.0–1.2)
CO2: 20 mmol/L (ref 20–29)
Calcium: 9.4 mg/dL (ref 8.7–10.2)
Chloride: 105 mmol/L (ref 96–106)
Creatinine, Ser: 0.85 mg/dL (ref 0.76–1.27)
Globulin, Total: 2.1 g/dL (ref 1.5–4.5)
Glucose: 90 mg/dL (ref 65–99)
Potassium: 4.4 mmol/L (ref 3.5–5.2)
Sodium: 143 mmol/L (ref 134–144)
Total Protein: 6.7 g/dL (ref 6.0–8.5)
eGFR: 106 mL/min/{1.73_m2} (ref >59)

## 2020-06-15 LAB — LIPID PANEL
Chol/HDL Ratio: 5.6 ratio — ABNORMAL HIGH (ref 0.0–5.0)
Cholesterol, Total: 214 mg/dL — ABNORMAL HIGH (ref 100–199)
HDL: 38 mg/dL — ABNORMAL LOW (ref >39)
LDL Chol Calc (NIH): 123 mg/dL — ABNORMAL HIGH (ref 0–99)
Triglycerides: 298 mg/dL — ABNORMAL HIGH (ref 0–149)
VLDL Cholesterol Cal: 53 mg/dL — ABNORMAL HIGH (ref 5–40)

## 2020-06-15 LAB — HEPATITIS C ANTIBODY: Hep C Virus Ab: <0.1 s/co ratio (ref 0.0–0.9)

## 2020-06-15 LAB — HIV ANTIBODY (ROUTINE TESTING W REFLEX): HIV Screen 4th Generation wRfx: NONREACTIVE

## 2020-06-19 ENCOUNTER — Encounter: Payer: Self-pay | Admitting: Internal Medicine

## 2020-06-29 ENCOUNTER — Other Ambulatory Visit: Payer: Self-pay

## 2020-06-29 ENCOUNTER — Ambulatory Visit: Payer: Medicaid Other | Admitting: Family Medicine

## 2020-06-29 VITALS — BP 110/80 | HR 79 | Ht 65.0 in | Wt 166.0 lb

## 2020-06-29 DIAGNOSIS — D234 Other benign neoplasm of skin of scalp and neck: Secondary | ICD-10-CM | POA: Diagnosis not present

## 2020-06-29 DIAGNOSIS — L989 Disorder of the skin and subcutaneous tissue, unspecified: Secondary | ICD-10-CM | POA: Diagnosis present

## 2020-06-29 NOTE — Patient Instructions (Signed)
Wound Care, Adult Taking care of your wound properly can help to prevent pain, infection, and scarring. It can also help your wound heal more quickly. Follow instructions from your health care provider about how to care for your wound. Supplies needed:  Soap and water.  Wound cleanser.  Gauze.  If needed, a clean bandage (dressing) or other type of wound dressing material to cover or place in the wound. Follow your health care provider's instructions about what dressing supplies to use.  Cream or ointment to apply to the wound, if told by your health care provider. How to care for your wound Cleaning the wound Ask your health care provider how to clean the wound. This may include:  Using mild soap and water or a wound cleanser.  Using a clean gauze to pat the wound dry after cleaning it. Do not rub or scrub the wound. Dressing care  Wash your hands with soap and water for at least 20 seconds before and after you change the dressing. If soap and water are not available, use hand sanitizer.  Change your dressing as told by your health care provider. This may include: ? Cleaning or rinsing out (irrigating) the wound. ? Placing a dressing over the wound or in the wound (packing). ? Covering the wound with an outer dressing.  Leave any stitches (sutures), skin glue, or adhesive strips in place. These skin closures may need to stay in place for 2 weeks or longer. If adhesive strip edges start to loosen and curl up, you may trim the loose edges. Do not remove adhesive strips completely unless your health care provider tells you to do that.  Ask your health care provider when you can leave the wound uncovered. Checking for infection Check your wound area every day for signs of infection. Check for:  More redness, swelling, or pain.  Fluid or blood.  Warmth.  Pus or a bad smell.   Follow these instructions at home Medicines  If you were prescribed an antibiotic medicine, cream, or  ointment, take or apply it as told by your health care provider. Do not stop using the antibiotic even if your condition improves.  If you were prescribed pain medicine, take it 30 minutes before you do any wound care or as told by your health care provider.  Take over-the-counter and prescription medicines only as told by your health care provider. Eating and drinking  Eat a diet that includes protein, vitamin A, vitamin C, and other nutrient-rich foods to help the wound heal. ? Foods rich in protein include meat, fish, eggs, dairy, beans, and nuts. ? Foods rich in vitamin A include carrots and dark green, leafy vegetables. ? Foods rich in vitamin C include citrus fruits, tomatoes, broccoli, and peppers.  Drink enough fluid to keep your urine pale yellow. General instructions  Do not take baths, swim, use a hot tub, or do anything that would put the wound underwater until your health care provider approves. Ask your health care provider if you may take showers. You may only be allowed to take sponge baths.  Do not scratch or pick at the wound. Keep it covered as told by your health care provider.  Return to your normal activities as told by your health care provider. Ask your health care provider what activities are safe for you.  Protect your wound from the sun when you are outside for the first 6 months, or for as long as told by your health care provider. Cover   up the scar area or apply sunscreen that has an SPF of at least 30.  Do not use any products that contain nicotine or tobacco, such as cigarettes, e-cigarettes, and chewing tobacco. These may delay wound healing. If you need help quitting, ask your health care provider.  Keep all follow-up visits as told by your health care provider. This is important. Contact a health care provider if:  You received a tetanus shot and you have swelling, severe pain, redness, or bleeding at the injection site.  Your pain is not controlled  with medicine.  You have any of these signs of infection: ? More redness, swelling, or pain around the wound. ? Fluid or blood coming from the wound. ? Warmth coming from the wound. ? Pus or a bad smell coming from the wound. ? A fever or chills.  You are nauseous or you vomit.  You are dizzy. Get help right away if:  You have a red streak of skin near the area around your wound.  Your wound has been closed with staples, sutures, skin glue, or adhesive strips and it begins to open up and separate.  Your wound is bleeding, and the bleeding does not stop with gentle pressure.  You have a rash.  You faint.  You have trouble breathing. These symptoms may represent a serious problem that is an emergency. Do not wait to see if the symptoms will go away. Get medical help right away. Call your local emergency services (911 in the [place] Do not drive yourself to the hospital. Summary  Always wash your hands with soap and water for at least 20 seconds before and after changing your dressing.  Change your dressing as told by your health care provider.  To help with healing, eat foods that are rich in protein, vitamin A, vitamin C, and other nutrients.  Check your wound every day for signs of infection. Contact your health care provider if you suspect that your wound is infected. This information is not intended to replace advice given to you by your health care provider. Make sure you discuss any questions you have with your health care provider. Document Revised: 12/04/2018 Document Reviewed: 12/04/2018 Elsevier Patient Education  2021 Elsevier Inc.  

## 2020-06-29 NOTE — Progress Notes (Signed)
    SUBJECTIVE:   CHIEF COMPLAINT / HPI:   Patient reports that this lesion has been there for many years.  He has had it since a child.  He reports that it has grown over the years but over the last year has not grown much.  It does occasionally bleed.  It is not extremely bothersome but he does not like how it looks and wants it removed.  Denies any lesions anywhere else on his body.  OBJECTIVE:   BP 110/80   Pulse 79   Ht 5\' 5"  (1.651 m)   Wt 166 lb (75.3 kg)   SpO2 95%   BMI 27.62 kg/m   General: Well-appearing 50 year old male Derm: Raised, sessile, skin colored, 1.5 cm lesion 3 inches superior from right ear.  See image below.     PRE-OP DIAGNOSIS: Benign skin lesion POST-OP DIAGNOSIS: Same  PROCEDURE: skin lesion excision Performing Physician: Dr. Caron Presume  Supervising Physician (if applicable): Dr. Gwendlyn Deutscher   PROCEDURE:  Hyfrecation  The area surrounding the skin lesion was prepared and draped in the  usual sterile manner. The lesion was removed in the usual manner by the  biopsy method noted above. Hemostasis was assured.  Closure:  None, lesion was wrapped with pressure bandage  Followup: The patient tolerated the procedure well without  complications.  Standard post-procedure care is explained and return  precautions are given  ASSESSMENT/PLAN:   Benign neoplasm of skin Patient presented for removal of the lesion.  Hyfrecation performed, see procedure above.  Patient is scheduled for follow-up in 2 weeks to assess the healing wound.  Strict return and ED precautions given.  Wound care given.     Gifford Shave, MD [place]

## 2020-06-29 NOTE — Assessment & Plan Note (Signed)
Patient presented for removal of the lesion.  Hyfrecation performed, see procedure above.  Patient is scheduled for follow-up in 2 weeks to assess the healing wound.  Strict return and ED precautions given.  Wound care given.

## 2020-07-18 NOTE — Progress Notes (Signed)
Biopsy result discussed. Benign Melanocytic nevus. Excision site is healing well. Wound check appointment with PCP made.

## 2020-07-24 ENCOUNTER — Ambulatory Visit: Payer: Medicaid Other | Admitting: Family Medicine

## 2020-08-01 ENCOUNTER — Ambulatory Visit: Payer: Medicaid Other

## 2020-08-16 ENCOUNTER — Encounter: Payer: Self-pay | Admitting: Family Medicine

## 2020-08-16 ENCOUNTER — Ambulatory Visit (INDEPENDENT_AMBULATORY_CARE_PROVIDER_SITE_OTHER): Payer: Medicaid Other | Admitting: Family Medicine

## 2020-08-16 ENCOUNTER — Other Ambulatory Visit: Payer: Self-pay

## 2020-08-16 DIAGNOSIS — D234 Other benign neoplasm of skin of scalp and neck: Secondary | ICD-10-CM | POA: Diagnosis not present

## 2020-08-16 NOTE — Progress Notes (Signed)
    SUBJECTIVE:   CHIEF COMPLAINT / HPI:   Follow-up for excision of scalp nevus Peter Steele reports that he has been recovering well from the excision of his scalp nevus about 6 weeks ago.  He noted that shortly after the procedure, there was a small amount of bleeding from the site but none recently.  He is not having any significant tenderness or discomfort at the sites.   Hearing concerns Since her last visit, he has been seen by audiology reports that he would benefit from hearing aids.  It sounds like he is in the process of being fitted for hearing aids.  Screening for colon cancer Since her last visit, he has gotten set up with GI and plans to be seen in the office tomorrow.  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  No acute distress. HEENT: Well-appearing incision site without overt evidence of erythema or purulence.  Scabbing noted around the edges covering a total area about 2 cm x 2 cm.  No tenderness with palpation around the incision site. Respiratory: Breathing comfortably on room air.  No respiratory distress.  ASSESSMENT/PLAN:   Benign neoplasm of skin Pathology report shows a benign nevus.  The incision appears well-healing at this time.  No additional follow-up needed.   Hearing concerns Follow-up with audiology for hearing aids  Screening for colon cancer Follow-up with GI for colonoscopy   Matilde Haymaker, MD [place]

## 2020-08-16 NOTE — Assessment & Plan Note (Signed)
Pathology report shows a benign nevus.  The incision appears well-healing at this time.  No additional follow-up needed.

## 2020-08-17 ENCOUNTER — Ambulatory Visit (INDEPENDENT_AMBULATORY_CARE_PROVIDER_SITE_OTHER): Payer: Self-pay | Admitting: *Deleted

## 2020-08-17 VITALS — Ht 65.0 in | Wt 166.4 lb

## 2020-08-17 DIAGNOSIS — Z1211 Encounter for screening for malignant neoplasm of colon: Secondary | ICD-10-CM

## 2020-08-17 MED ORDER — NA SULFATE-K SULFATE-MG SULF 17.5-3.13-1.6 GM/177ML PO SOLN: 1.0000 | Freq: Once | ORAL | 0 refills | Status: AC

## 2020-08-17 NOTE — Progress Notes (Signed)
Gastroenterology Pre-Procedure Review  Request Date: 08/17/2020 Requesting Physician: Dr. Matilde Haymaker @ [facility], no previous TCS  PATIENT REVIEW QUESTIONS: The patient responded to the following health history questions as indicated:    1. Diabetes Melitis: no 2. Joint replacements in the past 12 months: no 3. Major health problems in the past 3 months: no 4. Has an artificial valve or MVP: no 5. Has a defibrillator: no 6. Has been advised in past to take antibiotics in advance of a procedure like teeth cleaning: no 7. Family history of colon cancer: no  8. Alcohol Use: no 9. Illicit drug Use: no 10. History of sleep apnea: no  11. History of coronary artery or other vascular stents placed within the last 12 months: no 12. History of any prior anesthesia complications: no 13. Body mass index is 27.69 kg/m.    MEDICATIONS & ALLERGIES:    Patient reports the following regarding taking any blood thinners:   Plavix? no Aspirin? no Coumadin? no Brilinta? no Xarelto? no Eliquis? no Pradaxa? no Savaysa? no Effient? no  Patient confirms/reports the following medications:  No current outpatient medications on file.   No current facility-administered medications for this visit.    Patient confirms/reports the following allergies:  Allergies  Allergen Reactions   Bee Venom    Doxycycline Hyclate    Vinegar [Acetic Acid]     No orders of the defined types were placed in this encounter.   AUTHORIZATION INFORMATION Primary Insurance: [place] [employer] [place],  [place] #: [id number],  Group #: [id number]  SCHEDULE INFORMATION: Procedure has been scheduled as follows:  Date: 08/23/2020, Time: 12:00  Location: [facility] with Dr. Gala Romney  This Gastroenterology Pre-Precedure Review Form is being routed to the following provider(s): Aliene Altes, PA

## 2020-08-17 NOTE — Patient Instructions (Signed)
Peter Steele  04-30-1970 MRN: [id number]     Procedure Date:  08/23/2020 Time to register: 11:00 AM Place to register: [facility] Scheduled provider: Dr. Gala Romney    PREPARATION FOR COLONOSCOPY WITH SUPREP BOWEL PREP KIT  Note: Suprep Bowel Prep Kit is a split-dose (2day) regimen. Consumption of BOTH 6-ounce bottles is required for a complete prep.  Please notify us immediately if you are diabetic, take iron supplements, or if you are on Coumadin or any other blood thinners.  Please hold the following medications: n/a                                                                                                                                                  2 DAYS BEFORE PROCEDURE:  DATE: 08/21/2020   DAY: Monday Begin clear liquid diet AFTER your lunch meal. NO SOLID FOODS after this point.  1 DAY BEFORE PROCEDURE:  DATE: 08/22/2020   DAY: Tuesday Continue clear liquids the entire day - NO SOLID FOOD.   Diabetic medications adjustments for today: n/a  At 8:00 am: Complete steps 1 through 4 below, using ONE (1) 6-ounce bottle. Step 1:  Pour ONE (1) 6-ounce bottle of SUPREP liquid into the mixing container.  Step 2:  Add cool drinking water to the 16 ounce line on the container and mix.  Note: Dilute the solution concentrate as directed prior to use. Step 3:  DRINK ALL the liquid in the container. Step 4:  You MUST drink an additional two (2) or more 16 ounce containers of water over the next one (1) hour.   At 6:00pm: Complete steps 1 through 4 below, using ONE (1) 6-ounce bottle, before going to bed. Step 1:  Pour ONE (1) 6-ounce bottle of SUPREP liquid into the mixing container.  Step 2:  Add cool drinking water to the 16 ounce line on the container and mix.  Note: Dilute the solution concentrate as directed prior to use. Step 3:  DRINK ALL the liquid in the container. Step 4:  You MUST drink an additional two (2) or more 16 ounce containers of water over the  next one (1) hour.   Continue clear liquids.  DAY OF PROCEDURE:   DATE: 08/23/2020   DAY: Wednesday If you take medications for your heart, blood pressure, or breathing, you may take these medications.  Diabetic medications adjustments for today: n/a  Nothing by mouth past 9:00 am.  You may take your morning medications with sip of water unless we have instructed otherwise.    Please see below for Dietary Information.  CLEAR LIQUIDS INCLUDE:  Water Jello (NOT red in color)   Ice Popsicles (NOT red in color)   Tea (sugar ok, no milk/cream) Powdered fruit flavored drinks  Coffee (sugar ok, no milk/cream) Gatorade/ Lemonade/ Kool-Aid  (NOT red in color)  Juice: apple, white grape, white cranberry Soft drinks  Clear bullion, consomme, broth (fat free beef/chicken/vegetable)  Carbonated beverages (any kind)  Strained chicken noodle soup Hard Candy   Remember: Clear liquids are liquids that will allow you to see your fingers on the other side of a clear glass. Be sure liquids are NOT red in color, and not cloudy, but CLEAR.  DO NOT EAT OR DRINK ANY OF THE FOLLOWING:  Dairy products of any kind   Cranberry juice Tomato juice / V8 juice   Grapefruit juice Orange juice     Red grape juice  Do not eat any solid foods, including such foods as: cereal, oatmeal, yogurt, fruits, vegetables, creamed soups, eggs, bread, crackers, pureed foods in a blender, etc.   HELPFUL HINTS FOR DRINKING PREP SOLUTION:  Make sure prep is extremely cold. Mix and refrigerate the the morning of the prep. You may also put in the freezer.  You may try mixing some Crystal Light or Country Time Lemonade if you prefer. Mix in small amounts; add more if necessary. Try drinking through a straw Rinse mouth with water or a mouthwash between glasses, to remove after-taste. Try sipping on a cold beverage /ice/ popsicles between glasses of prep. Place a piece of sugar-free hard candy in mouth between glasses. If you  become nauseated, try consuming smaller amounts, or stretch out the time between glasses. Stop for 30-60 minutes, then slowly start back drinking.     OTHER INSTRUCTIONS  You will need a responsible adult at least 51 years of age to accompany you and drive you home. This person must remain in the waiting room during your procedure. The hospital will cancel your procedure if you do not have a responsible adult with you.   Wear loose fitting clothing that is easily removed. Leave jewelry and other valuables at home.  Remove all body piercing jewelry and leave at home. Total time from sign-in until discharge is approximately 2-3 hours. You should go home directly after your procedure and rest. You can resume normal activities the day after your procedure. The day of your procedure you should not: Drive Make legal decisions Operate machinery Drink alcohol Return to work   You may call the office (Dept: [phone]) before 5:00pm, or page the doctor on call [phone]) after 5:00pm, for further instructions, if necessary.   Insurance Information YOU WILL NEED TO CHECK WITH YOUR INSURANCE COMPANY FOR THE BENEFITS OF COVERAGE YOU HAVE FOR THIS PROCEDURE.  UNFORTUNATELY, NOT ALL INSURANCE COMPANIES HAVE BENEFITS TO COVER ALL OR PART OF THESE TYPES OF PROCEDURES.  IT IS YOUR RESPONSIBILITY TO CHECK YOUR BENEFITS, HOWEVER, WE WILL BE GLAD TO ASSIST YOU WITH ANY CODES YOUR INSURANCE COMPANY MAY NEED.    PLEASE NOTE THAT MOST INSURANCE COMPANIES WILL NOT COVER A SCREENING COLONOSCOPY FOR PEOPLE UNDER THE AGE OF 50  IF YOU HAVE BCBS INSURANCE, YOU MAY HAVE BENEFITS FOR A SCREENING COLONOSCOPY BUT IF POLYPS ARE FOUND THE DIAGNOSIS WILL CHANGE AND THEN YOU MAY HAVE A DEDUCTIBLE THAT WILL NEED TO BE MET. SO PLEASE MAKE SURE YOU CHECK YOUR BENEFITS FOR A SCREENING COLONOSCOPY AS WELL AS A DIAGNOSTIC COLONOSCOPY.

## 2020-08-18 NOTE — Progress Notes (Signed)
Ok to schedule.

## 2020-08-21 ENCOUNTER — Other Ambulatory Visit: Payer: Self-pay | Admitting: *Deleted

## 2020-08-21 ENCOUNTER — Encounter (HOSPITAL_COMMUNITY): Payer: Self-pay | Admitting: Internal Medicine

## 2020-08-21 NOTE — Progress Notes (Signed)
ASA II per Aliene Altes, PA.

## 2020-08-23 ENCOUNTER — Encounter (HOSPITAL_COMMUNITY)
Admission: RE | Disposition: A | Discharge status: Home / Self Care | Payer: Self-pay | Source: Home / Self Care | Attending: Internal Medicine

## 2020-08-23 ENCOUNTER — Other Ambulatory Visit: Payer: Self-pay

## 2020-08-23 ENCOUNTER — Ambulatory Visit (HOSPITAL_COMMUNITY)
Admission: RE | Admit: 2020-08-23 | Discharge: 2020-08-23 | Disposition: A | Discharge status: Home / Self Care | Payer: Medicaid Other | Attending: Internal Medicine | Admitting: Internal Medicine

## 2020-08-23 ENCOUNTER — Encounter (HOSPITAL_COMMUNITY): Payer: Self-pay | Admitting: Internal Medicine

## 2020-08-23 DIAGNOSIS — Z87891 Personal history of nicotine dependence: Secondary | ICD-10-CM | POA: Diagnosis not present

## 2020-08-23 DIAGNOSIS — D123 Benign neoplasm of transverse colon: Secondary | ICD-10-CM | POA: Insufficient documentation

## 2020-08-23 DIAGNOSIS — Z1211 Encounter for screening for malignant neoplasm of colon: Secondary | ICD-10-CM | POA: Insufficient documentation

## 2020-08-23 DIAGNOSIS — Z881 Allergy status to other antibiotic agents status: Secondary | ICD-10-CM | POA: Diagnosis not present

## 2020-08-23 DIAGNOSIS — K635 Polyp of colon: Secondary | ICD-10-CM

## 2020-08-23 HISTORY — PX: COLONOSCOPY: SHX5424

## 2020-08-23 HISTORY — PX: POLYPECTOMY: SHX5525

## 2020-08-23 SURGERY — COLONOSCOPY
Anesthesia: Moderate Sedation

## 2020-08-23 MED ORDER — ONDANSETRON HCL 4 MG/2ML IJ SOLN
INTRAMUSCULAR | Status: AC
  Filled 2020-08-23: qty 2

## 2020-08-23 MED ORDER — MEPERIDINE HCL 50 MG/ML IJ SOLN
INTRAMUSCULAR | Status: AC
  Filled 2020-08-23: qty 1

## 2020-08-23 MED ORDER — MIDAZOLAM HCL 5 MG/5ML IJ SOLN
INTRAMUSCULAR | Status: DC | PRN
  Administered 2020-08-23 (×2): 1 mg via INTRAVENOUS
  Administered 2020-08-23: 2 mg via INTRAVENOUS

## 2020-08-23 MED ORDER — SODIUM CHLORIDE 0.9 % IV SOLN
INTRAVENOUS | Status: DC
  Administered 2020-08-23: 1000 mL via INTRAVENOUS

## 2020-08-23 MED ORDER — ONDANSETRON HCL 4 MG/2ML IJ SOLN
INTRAMUSCULAR | Status: DC | PRN
  Administered 2020-08-23: 4 mg via INTRAVENOUS

## 2020-08-23 MED ORDER — MIDAZOLAM HCL 5 MG/5ML IJ SOLN
INTRAMUSCULAR | Status: AC
  Filled 2020-08-23: qty 10

## 2020-08-23 MED ORDER — MEPERIDINE HCL 100 MG/ML IJ SOLN
INTRAMUSCULAR | Status: DC | PRN
  Administered 2020-08-23: 25 mg via INTRAVENOUS

## 2020-08-23 MED ORDER — STERILE WATER FOR IRRIGATION IR SOLN
Status: DC | PRN
  Administered 2020-08-23: 1.5 mL

## 2020-08-23 NOTE — Op Note (Signed)
[facility] Patient Name: Peter Steele Procedure Date: 08/23/2020 10:34 AM MRN: [id number] Date of Birth: 10-17-1970 Attending MD: Norvel Richards , MD CSN: [id number] Age: 50 Admit Type: Outpatient Procedure:                Colonoscopy Indications:              Screening for colorectal malignant neoplasm Providers:                Norvel Richards, MD, Charlsie Quest. Insurance claims handler, Therapist, sports,                            Suzan Garibaldi. Risa Grill, Technician Referring MD:              Medicines:                Midazolam 4 mg IV, Meperidine 25 mg IV Complications:            No immediate complications. Estimated Blood Loss:     Estimated blood loss was minimal. Procedure:                Pre-Anesthesia Assessment:                           - Prior to the procedure, a History and Physical                            was performed, and patient medications and                            allergies were reviewed. The patient's tolerance of                            previous anesthesia was also reviewed. The risks                            and benefits of the procedure and the sedation                            options and risks were discussed with the patient.                            All questions were answered, and informed consent                            was obtained. Prior Anticoagulants: The patient has                            taken no previous anticoagulant or antiplatelet                            agents. ASA Grade Assessment: III - A patient with                            severe systemic disease. After reviewing the risks  and benefits, the patient was deemed in                            satisfactory condition to undergo the procedure.                           After obtaining informed consent, the colonoscope                            was passed under direct vision. Throughout the                            procedure, the patient's blood  pressure, pulse, and                            oxygen saturations were monitored continuously. The                            CF-[id number] ([id number]) scope was introduced through                            the anus and advanced to the the cecum, identified                            by appendiceal orifice and ileocecal valve. The                            colonoscopy was performed without difficulty. The                            patient tolerated the procedure well. The quality                            of the bowel preparation was adequate. Scope In: 11:01:52 AM Scope Out: 11:16:58 AM Scope Withdrawal Time: 0 hours 9 minutes 25 seconds  Total Procedure Duration: 0 hours 15 minutes 6 seconds  Findings:      The perianal and digital rectal examinations were normal.      A 5 mm polyp was found in the splenic flexure. The polyp was sessile.       The polyp was removed with a cold snare. Resection and retrieval were       complete. Estimated blood loss was minimal.      The exam was otherwise without abnormality on direct and retroflexion       views. Impression:               - One 5 mm polyp at the splenic flexure, removed                            with a cold snare. Resected and retrieved.                           - The examination was otherwise normal on direct  and retroflexion views. Moderate Sedation:      Moderate (conscious) sedation was administered by the endoscopy nurse       and supervised by the endoscopist. The following parameters were       monitored: oxygen saturation, heart rate, blood pressure, and response       to care. Total physician intraservice time was 21 minutes. Recommendation:           - Discharge patient to home.                           - Patient has a contact number available for                            emergencies. The signs and symptoms of potential                            delayed complications were discussed with the                             patient. Return to normal activities tomorrow.                            Written discharge instructions were provided to the                            patient.                           - Resume previous diet.                           - Continue present medications.                           - Repeat colonoscopy date to be determined after                            pending pathology results are reviewed for                            surveillance.                           - Return to GI office (date not yet determined). Procedure Code(s):        --- Professional ---                           [phone], Colonoscopy, flexible; with removal of                            tumor(s), polyp(s), or other lesion(s) by snare                            technique                           G0500,  Moderate sedation services provided by the                            same physician or other qualified health care                            professional performing a gastrointestinal                            endoscopic service that sedation supports,                            requiring the presence of an independent trained                            observer to assist in the monitoring of the                            patient's level of consciousness and physiological                            status; initial 15 minutes of intra-service time;                            patient age 19 years or older (additional time may                            be reported with [phone], as appropriate) Diagnosis Code(s):        --- Professional ---                           Z12.11, Encounter for screening for malignant                            neoplasm of colon                           K63.5, Polyp of colon CPT copyright 2019 American Medical Association. All rights reserved. The codes documented in this report are preliminary and upon coder review may  be revised to meet current  compliance requirements. Cristopher Estimable. Gracie Gupta, MD Norvel Richards, MD 08/23/2020 11:24:19 AM This report has been signed electronically. Number of Addenda: 0

## 2020-08-23 NOTE — H&P (Signed)
@LOGO @   Primary Care Physician:  Matilde Haymaker, MD Primary Gastroenterologist:  Dr. Gala Romney  Pre-Procedure History & Physical: HPI:  Peter Steele is a 50 y.o. male is here for a screening colonoscopy.  No bowel symptoms.  No family history of colon cancer.  No prior colonoscopy.  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[place])    Followed by [facility] Neurology for this    Past Surgical History:  Procedure Laterality Date   ADENOIDECTOMY     CHOLECYSTECTOMY     Can't remember year    Prior to Admission medications   Not on File    Allergies as of 08/21/2020 - Review Complete 08/21/2020  Allergen Reaction Noted   Bee venom  03/17/2020   Doxycycline hyclate  09/10/2019   Vinegar [acetic acid]  03/17/2020    Family History  Problem Relation Age of Onset   Heart disease Mother    Stroke Mother    Early death Mother    Diabetes Father    Heart disease Father    Hyperlipidemia Father    High blood pressure Father    Asthma Sister    Heart disease Sister    Hyperlipidemia Sister    High blood pressure Sister     Social History   Socioeconomic History   Marital status: Single    Spouse name: Not on file   Number of children: Not on file   Years of education: Not on file   Highest education level: Not on file  Occupational History   Occupation: not employed  Tobacco Use   Smoking status: Former    Pack years: 0.00    Types: Cigarettes    Quit date: 06/13/1995    Years since quitting: 25.2   Smokeless tobacco: Never  Vaping Use   Vaping Use: Never used  Substance and Sexual Activity   Alcohol use: Not Currently   Drug use: Never   Sexual activity: Not Currently    Comment: Girlfriend died 5 years ago. not active since  Other Topics Concern   Not on file  Social History Narrative   Not on file   Social Determinants of Health   Financial Resource Strain: Not on file  Food Insecurity: Not on file  Transportation Needs: Not  on file  Physical Activity: Not on file  Stress: Not on file  Social Connections: Not on file  Intimate Partner Violence: Not on file    Review of Systems: See HPI, otherwise negative ROS  Physical Exam: BP 117/79   Pulse 70   Temp 98.3 F (36.8 C) (Oral)   Resp 15   Ht 5\' 5"  (1.651 m)   Wt 74.8 kg   SpO2 96%   BMI 27.46 kg/m  General:   Alert,  Well-developed, well-nourished, pleasant and cooperative in NAD  Lungs:  Clear throughout to auscultation.   No wheezes, crackles, or rhonchi. No acute distress. Heart:  Regular rate and rhythm; no murmurs, clicks, rubs,  or gallops. Abdomen:  Soft, nontender and nondistended. No masses, hepatosplenomegaly or hernias noted. Normal bowel sounds, without guarding, and without rebound.   Impression/Plan: Peter Steele is now here to undergo a screening colonoscopy.  First ever average risk screening examination.  Risks, benefits, limitations, imponderables and alternatives regarding colonoscopy have been reviewed with the patient. Questions have been answered. All parties agreeable.     Notice:  This dictation was prepared with Dragon dictation along with smaller phrase technology. Any transcriptional  errors that result from this process are unintentional and may not be corrected upon review.

## 2020-08-23 NOTE — Discharge Instructions (Signed)
  Colonoscopy Discharge Instructions  Read the instructions outlined below and refer to this sheet in the next few weeks. These discharge instructions provide you with general information on caring for yourself after you leave the hospital. Your doctor may also give you specific instructions. While your treatment has been planned according to the most current medical practices available, unavoidable complications occasionally occur. If you have any problems or questions after discharge, call Dr. Gala Romney at [phone]. ACTIVITY You may resume your regular activity, but move at a slower pace for the next 24 hours.  Take frequent rest periods for the next 24 hours.  Walking will help get rid of the air and reduce the bloated feeling in your belly (abdomen).  No driving for 24 hours (because of the medicine (anesthesia) used during the test).   Do not sign any important legal documents or operate any machinery for 24 hours (because of the anesthesia used during the test).  NUTRITION Drink plenty of fluids.  You may resume your normal diet as instructed by your doctor.  Begin with a light meal and progress to your normal diet. Heavy or fried foods are harder to digest and may make you feel sick to your stomach (nauseated).  Avoid alcoholic beverages for 24 hours or as instructed.  MEDICATIONS You may resume your normal medications unless your doctor tells you otherwise.  WHAT YOU CAN EXPECT TODAY Some feelings of bloating in the abdomen.  Passage of more gas than usual.  Spotting of blood in your stool or on the toilet paper.  IF YOU HAD POLYPS REMOVED DURING THE COLONOSCOPY: No aspirin products for 7 days or as instructed.  No alcohol for 7 days or as instructed.  Eat a soft diet for the next 24 hours.  FINDING OUT THE RESULTS OF YOUR TEST Not all test results are available during your visit. If your test results are not back during the visit, make an appointment with your caregiver to find out the  results. Do not assume everything is normal if you have not heard from your caregiver or the medical facility. It is important for you to follow up on all of your test results.  SEEK IMMEDIATE MEDICAL ATTENTION IF: You have more than a spotting of blood in your stool.  Your belly is swollen (abdominal distention).  You are nauseated or vomiting.  You have a temperature over 101.  You have abdominal pain or discomfort that is severe or gets worse throughout the day.     1 polyp removed from your colon today  Further recommendations to follow pending review of pathology report  At patient request I called Ovid Curd at [phone]-reviewed findings and recommendations

## 2020-08-24 LAB — SURGICAL PATHOLOGY

## 2020-08-25 ENCOUNTER — Telehealth: Payer: Self-pay

## 2020-08-25 ENCOUNTER — Encounter: Payer: Self-pay | Admitting: Internal Medicine

## 2020-08-25 NOTE — Telephone Encounter (Signed)
Please NIC for 7 year repeat colonoscopy

## 2020-08-29 ENCOUNTER — Encounter (HOSPITAL_COMMUNITY): Payer: Self-pay | Admitting: Internal Medicine

## 2020-09-12 ENCOUNTER — Encounter (INDEPENDENT_AMBULATORY_CARE_PROVIDER_SITE_OTHER): Payer: Self-pay | Admitting: Ophthalmology

## 2020-09-12 ENCOUNTER — Other Ambulatory Visit: Payer: Self-pay

## 2020-09-12 ENCOUNTER — Ambulatory Visit (INDEPENDENT_AMBULATORY_CARE_PROVIDER_SITE_OTHER): Payer: Medicaid Other | Admitting: Ophthalmology

## 2020-09-12 DIAGNOSIS — H35033 Hypertensive retinopathy, bilateral: Secondary | ICD-10-CM

## 2020-09-12 DIAGNOSIS — H3581 Retinal edema: Secondary | ICD-10-CM | POA: Diagnosis not present

## 2020-09-12 DIAGNOSIS — D3132 Benign neoplasm of left choroid: Secondary | ICD-10-CM | POA: Diagnosis not present

## 2020-09-12 DIAGNOSIS — H353111 Nonexudative age-related macular degeneration, right eye, early dry stage: Secondary | ICD-10-CM

## 2020-09-12 DIAGNOSIS — I10 Essential (primary) hypertension: Secondary | ICD-10-CM

## 2020-09-12 DIAGNOSIS — H25813 Combined forms of age-related cataract, bilateral: Secondary | ICD-10-CM

## 2020-09-12 NOTE — Progress Notes (Signed)
[facility] Note  09/12/2020     CHIEF COMPLAINT Patient presents for Retina Follow Up   HISTORY OF PRESENT ILLNESS: Peter Steele is a 50 y.o. male who presents to the clinic today for:   HPI     Retina Follow Up   Patient presents with  Dry AMD.  In right eye.  This started 6 months ago.  I, the attending physician,  performed the HPI with the patient and updated documentation appropriately.        Comments   Patient here for 6 months retina follow up for non exu ARMD OD. Patient states vision about the same. No eye pain. Had polyps removed.      Last edited by Bernarda Caffey, MD on 09/14/2020  3:01 PM.    Pt states no change in vision, no new problems   Referring physician: Thurston Hole, O.D. [facility] [address] [place],  [place] [address]  HISTORICAL INFORMATION:   Selected notes from the MEDICAL RECORD NUMBER Referred by Dr. Shirley Muscat   CURRENT MEDICATIONS: No current outpatient medications on file. (Ophthalmic Drugs)   No current facility-administered medications for this visit. (Ophthalmic Drugs)   No current outpatient medications on file. (Other)   No current facility-administered medications for this visit. (Other)      REVIEW OF SYSTEMS: ROS   Positive for: Eyes Negative for: Constitutional, Gastrointestinal, Neurological, Skin, Genitourinary, Musculoskeletal, HENT, Endocrine, Cardiovascular, Respiratory, Psychiatric, Allergic/Imm, Heme/Lymph Last edited by Theodore Demark, COA on 09/12/2020  1:00 PM.        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[place])    Followed by [facility] Neurology for this   Past Surgical History:   Procedure Laterality Date   ADENOIDECTOMY     CHOLECYSTECTOMY     Can't remember year   COLONOSCOPY N/A 08/23/2020   Procedure: COLONOSCOPY;  Surgeon: Daneil Dolin, MD;  [facility];  Service: Endoscopy;  Laterality: N/A;  ASA II / 12:00   POLYPECTOMY  08/23/2020   Procedure: POLYPECTOMY;  Surgeon: Daneil Dolin, MD;  [facility]: [facility];  Service: Endoscopy;;    FAMILY HISTORY Family History  Problem Relation Age of Onset   Heart disease Mother    Stroke Mother    Early death Mother    Diabetes Father    Heart disease Father    Hyperlipidemia Father    High blood pressure Father    Asthma Sister    Heart disease Sister    Hyperlipidemia Sister    High blood pressure Sister     SOCIAL HISTORY Social History   Tobacco Use   Smoking status: Former    Types: Cigarettes    Quit date: 06/13/1995    Years since quitting: 25.2   Smokeless tobacco: Never  Vaping Use   Vaping Use: Never used  Substance Use Topics   Alcohol use: Not Currently   Drug use: Never         OPHTHALMIC EXAM:  Base Eye Exam     Visual Acuity (Snellen - Linear)       Right Left   Dist cc 20/50 20/30 -1   Dist ph cc  20/40 NI    Correction: Glasses         Tonometry (Tonopen, 12:56 PM)       Right Left   Pressure 12 10         Pupils       Dark Light Shape React APD   Right 3 2 Round Brisk None   Left 3 2 Round Brisk None         Visual Fields (Counting fingers)       Left Right    Full Full         Extraocular Movement       Right Left    Full Full         Neuro/Psych     Oriented x3: Yes   Mood/Affect: Normal         Dilation     Both eyes: 1.0% Mydriacyl, 2.5% Phenylephrine @ 12:56 PM           Slit Lamp and Fundus Exam     Slit Lamp Exam       Right Left   Lids/Lashes Dermatochalasis - upper lid, mild Meibomian gland dysfunction, mild UL Ptosis Dermatochalasis - upper lid, mild Meibomian gland dysfunction, mild UL  Ptosis   Conjunctiva/Sclera White and quiet White and quiet   Cornea 2+ inferior Punctate epithelial erosions, mild tear film debris Trace Punctate epithelial erosions, trace Debris in tear film   Anterior Chamber Deep and quiet Deep and quiet   Iris Round and dilated Round and dilated   Lens 2+ Nuclear sclerosis, 2+ Cortical cataract, trace Posterior subcapsular cataract 2+ Nuclear sclerosis, 2+ Cortical cataract, trace Posterior subcapsular cataract   Vitreous mild Vitreous syneresis mild Vitreous syneresis         Fundus Exam       Right Left   Disc Pink and Sharp, Compact Pink and Sharp, Compact   C/D Ratio 0.2 0.1   Macula Blunted foveal reflex, focal RPE mottling, clumping and drusen IN macula -- stable from prior, No heme or edema Flat, Blunted foveal reflex, focal pigmented choroidal lesion with RPE mottling, clumping and drusen along IT arcades with trace elevation, no SRF or orange pigment -- stable, No heme or edema   Vessels Vascular attenuation, Tortuous Mild Vascular attenuation, mild tortuousity   Periphery Attached, no heme, trace reticular degeneration Attached, mild reticular degeneration, focal pigmented lesion along IT arcades with trace elevation, no SRF or orange pigment, no heme           Refraction     Wearing Rx       Sphere Cylinder Axis Add   Right -2.00 +1.50 080 +2.50   Left -1.75 +1.00 077 +2.50            IMAGING AND PROCEDURES  Imaging and Procedures for 09/12/2020  OCT, Retina - OU - Both Eyes       Right Eye Quality was good. Central Foveal Thickness: 298. Progression has been stable. Findings include normal foveal contour, no IRF, no SRF, retinal drusen , vitreomacular adhesion , outer retinal atrophy (focal drusen with ellipsoid thinning/ORA central macula - stable from prior).   Left Eye Quality was good. Central Foveal Thickness: 283. Progression has been stable. Findings include normal foveal contour, no IRF, no SRF, retinal  drusen , outer retinal atrophy (focal drusen with ellipsoid thinning/ORA temporal macula; hyper reflective choroidal lesion IT macula - stable from prior).   Notes *Images captured and stored on drive  Diagnosis /  Impression:  OD: NFP; no IRF/SRF; focal drusen with ellipsoid thinning/ORA central macula OS: NFP; no IRF/SRF; focal drusen with ellipsoid thinning/ORA temporal macula; hyper reflective choroidal lesion IT macula - stable from prior  Clinical management:  See below  Abbreviations: NFP - Normal foveal profile. CME - cystoid macular edema. PED - pigment epithelial detachment. IRF - intraretinal fluid. SRF - subretinal fluid. EZ - ellipsoid zone. ERM - epiretinal membrane. ORA - outer retinal atrophy. ORT - outer retinal tubulation. SRHM - subretinal hyper-reflective material. IRHM - intraretinal hyper-reflective material               ASSESSMENT/PLAN:    ICD-10-CM   1. 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 Retinal edema  H35.81 OCT, Retina - OU - Both Eyes    3. Choroidal nevus, left eye  D31.32     4. Essential hypertension  I10     5. Hypertensive retinopathy of both eyes  H35.033     6. Combined forms of age-related cataract of both eyes  H25.813        1,2. Age related macular degeneration, non-exudative, OD  - focal, atypical nonexudative ARMD OD -- atypical due to age  - exam and OCT show focal drusen and ellipsoid thinning inf nasal fovea  - FA (09.08.21) shows focal staining inf nasal fovea -- no central leakage or CNV  - The incidence, anatomy, and pathology of dry AMD, risk of progression, and the AREDS and AREDS 2 studies including smoking risks discussed with patient.  - Recommend amsler grid monitoring  - f/u 1 year, DFE, OCT  3. Choroidal Nevus, OS  - pigmented lesion along IT arcades  - no visual symptoms, SRF or orange pigment  - +drusen  - thickness < 32mm -- minimal elevation  -  discussed findings, prognosis  - recommend monitoring  - f/u 1 year, DFE, OCT  4,5. Hypertensive retinopathy OU - discussed importance of tight BP control - monitor  6. Mixed Cataract OU - The symptoms of cataract, surgical options, and treatments and risks were discussed with patient. - discussed diagnosis and progression - not yet visually significant - monitor for now  Ophthalmic Meds Ordered this visit:  No orders of the defined types were placed in this encounter.     Return in about 1 year (around 09/12/2021) for f/u non-exu ARMD OD, DFE, OCT.  There are no Patient Instructions on file for this visit.  Explained the diagnoses, plan, and follow up with the patient and they expressed understanding.  Patient expressed understanding of the importance of proper follow up care.   This document serves as a record of services personally performed by Gardiner Sleeper, MD, PhD. It was created on their behalf by Estill Bakes, COT an ophthalmic technician. The creation of this record is the provider's dictation and/or activities during the visit.    Electronically signed by: Estill Bakes, COT 7.12.22 @ 3:03 PM   This document serves as a record of services personally performed by Gardiner Sleeper, MD, PhD. It was created on their behalf by San Jetty. Owens Shark, OA an ophthalmic technician. The creation of this record is the provider's dictation and/or activities during the visit.    Electronically signed by: San Jetty. Owens Shark, [place] 07.12.2022 3:03 PM   Gardiner Sleeper, M.D., Ph.D. Diseases & Surgery of the Retina and Vitreous [facility]  I have reviewed the above documentation  for accuracy and completeness, and I agree with the above. Gardiner Sleeper, M.D., Ph.D. 09/14/20 3:03 PM  Abbreviations: M myopia (nearsighted); A astigmatism; H hyperopia (farsighted); P presbyopia; Mrx spectacle prescription;  CTL contact lenses; OD right eye; OS left eye; OU both eyes  XT  exotropia; ET esotropia; PEK punctate epithelial keratitis; PEE punctate epithelial erosions; DES dry eye syndrome; MGD meibomian gland dysfunction; ATs artificial tears; PFAT's preservative free artificial tears; [place] nuclear sclerotic cataract; PSC posterior subcapsular cataract; ERM epi-retinal membrane; PVD posterior vitreous detachment; RD retinal detachment; DM diabetes mellitus; DR diabetic retinopathy; NPDR non-proliferative diabetic retinopathy; PDR proliferative diabetic retinopathy; CSME clinically significant macular edema; DME diabetic macular edema; dbh dot blot hemorrhages; CWS cotton wool spot; POAG primary open angle glaucoma; C/D cup-to-disc ratio; HVF humphrey visual field; GVF goldmann visual field; OCT optical coherence tomography; IOP intraocular pressure; BRVO Branch retinal vein occlusion; CRVO central retinal vein occlusion; CRAO central retinal artery occlusion; BRAO branch retinal artery occlusion; RT retinal tear; SB scleral buckle; PPV pars plana vitrectomy; VH Vitreous hemorrhage; PRP panretinal laser photocoagulation; IVK intravitreal kenalog; VMT vitreomacular traction; MH Macular hole;  NVD neovascularization of the disc; NVE neovascularization elsewhere; AREDS age related eye disease study; ARMD age related macular degeneration; POAG primary open angle glaucoma; EBMD epithelial/anterior basement membrane dystrophy; ACIOL anterior chamber intraocular lens; IOL intraocular lens; PCIOL posterior chamber intraocular lens; Phaco/IOL phacoemulsification with intraocular lens placement; [place] photorefractive keratectomy; LASIK laser assisted in situ keratomileusis; HTN hypertension; DM diabetes mellitus; COPD chronic obstructive pulmonary disease

## 2020-09-14 ENCOUNTER — Encounter (INDEPENDENT_AMBULATORY_CARE_PROVIDER_SITE_OTHER): Payer: Self-pay | Admitting: Ophthalmology

## 2020-10-03 DIAGNOSIS — K529 Noninfective gastroenteritis and colitis, unspecified: Secondary | ICD-10-CM | POA: Diagnosis not present

## 2020-12-09 DIAGNOSIS — Z9049 Acquired absence of other specified parts of digestive tract: Secondary | ICD-10-CM | POA: Diagnosis not present

## 2020-12-09 DIAGNOSIS — N200 Calculus of kidney: Secondary | ICD-10-CM | POA: Diagnosis not present

## 2020-12-09 DIAGNOSIS — N132 Hydronephrosis with renal and ureteral calculous obstruction: Secondary | ICD-10-CM | POA: Diagnosis not present

## 2020-12-09 DIAGNOSIS — Z20822 Contact with and (suspected) exposure to covid-19: Secondary | ICD-10-CM | POA: Diagnosis not present

## 2020-12-09 DIAGNOSIS — R1032 Left lower quadrant pain: Secondary | ICD-10-CM | POA: Diagnosis not present

## 2020-12-11 ENCOUNTER — Telehealth: Payer: Self-pay

## 2020-12-11 NOTE — Telephone Encounter (Signed)
Transition Care Management Unsuccessful Follow-up Telephone Call  Date of discharge and from where:  12/09/2020-[facility]   Attempts:  1st Attempt  Reason for unsuccessful TCM follow-up call:  Unable to leave message

## 2020-12-12 NOTE — Telephone Encounter (Signed)
Transition Care Management Unsuccessful Follow-up Telephone Call  Date of discharge and from where:  12/09/2020 from [facility]  Attempts:  2nd Attempt  Reason for unsuccessful TCM follow-up call:  Unable to leave message

## 2020-12-13 NOTE — Telephone Encounter (Signed)
Transition Care Management Unsuccessful Follow-up Telephone Call  Date of discharge and from where:  12/09/2020 from [facility]  Attempts:  3rd Attempt  Reason for unsuccessful TCM follow-up call:  Unable to reach patient

## 2020-12-18 ENCOUNTER — Ambulatory Visit: Payer: Medicaid Other | Admitting: Cardiology

## 2020-12-18 NOTE — Progress Notes (Signed)
Cardiology Office Note:    Date:  12/19/2020   ID:  Peter Steele, DOB February 10, 1971, MRN [id number]  PCP:  Orvis Brill, [place] [facility] Cardiologist:  Donato Heinz, MD     Referring MD: Orvis Brill, DO   CC:  Follow up Myotonic Muscular Dystrophy  I connected with  Peter Steele on 12/19/20 by a video enabled telemedicine application and verified that I am speaking with the correct person using two identifiers.   I discussed the limitations of evaluation and management by telemedicine. The patient expressed understanding and agreed to proceed. Modality:  Virtual Visit Patient: Home Provider: [place] Office  History of Present Illness:    Peter Steele is a 50 y.o. male with a hx of myotonic muscular dystrophy seen last 03/17/20.  Patient notes that he is doing ok.  Has some kidney stones getting therapy on the 31st.  Is not having further stiffness, weakness, nor evidence or disease   No chest pain or pressure.  No SOB/DOE and no PND/Orthopnea.  No weight gain or leg swelling.  No palpitations or syncope .   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[place])    Followed by [facility] Neurology for this    Past Surgical History:  Procedure Laterality Date   ADENOIDECTOMY     CHOLECYSTECTOMY     Can't remember year   COLONOSCOPY N/A 08/23/2020   Procedure: COLONOSCOPY;  Surgeon: Daneil Dolin, MD;  [facility];  Service: Endoscopy;  Laterality: N/A;  ASA II / 12:00   POLYPECTOMY  08/23/2020   Procedure: POLYPECTOMY;  Surgeon: Daneil Dolin, MD;  [facility];  Service: Endoscopy;;    Current Medications: No outpatient medications have been marked as taking for the 12/19/20 encounter (Appointment) with Werner Lean, MD.     Allergies:   Bee venom, Vinegar [acetic acid], and Doxycycline hyclate   Social History   Socioeconomic History   Marital status: Single     Spouse name: Not on file   Number of children: Not on file   Years of education: Not on file   Highest education level: Not on file  Occupational History   Occupation: not employed  Tobacco Use   Smoking status: Former    Types: Cigarettes    Quit date: 06/13/1995    Years since quitting: 25.5   Smokeless tobacco: Never  Vaping Use   Vaping Use: Never used  Substance and Sexual Activity   Alcohol use: Not Currently   Drug use: Never   Sexual activity: Not Currently    Comment: Girlfriend died 5 years ago. not active since  Other Topics Concern   Not on file  Social History Narrative   Not on file   Social Determinants of Health   Financial Resource Strain: Not on file  Food Insecurity: Not on file  Transportation Needs: Not on file  Physical Activity: Not on file  Stress: Not on file  Social Connections: Not on file     Family History: The patient's family history includes Asthma in his sister; Diabetes in his father; Early death in his mother; Heart disease in his father, mother, and sister; High blood pressure in his father and sister; Hyperlipidemia in his father and sister; Stroke in his mother.  ROS:   Please see the history of present illness.     All other systems reviewed and are negative.  EKGs/Labs/Other Studies Reviewed:  The following studies were reviewed today:  Transthoracic Echocardiogram: Date: 07/07/2019 Results:  1. Left ventricular ejection fraction, by estimation, is 60 to 65%. The  left ventricle has normal function. The left ventricle has no regional  wall motion abnormalities. Left ventricular diastolic parameters are  indeterminate.   2. Right ventricular systolic function is normal. The right ventricular  size is normal. There is normal pulmonary artery systolic pressure.   3. The mitral valve is normal in structure. No evidence of mitral valve  regurgitation. No evidence of mitral stenosis.   4. The aortic valve is tricuspid.  Aortic valve regurgitation is not  visualized. No aortic stenosis is present.   Recent Labs: 06/14/2020: ALT 29; BUN 15; Creatinine, Ser 0.85; Potassium 4.4; Sodium 143  Recent Lipid Panel    Component Value Date/Time   CHOL 214 (H) 06/14/2020 1538   TRIG 298 (H) 06/14/2020 1538   HDL 38 (L) 06/14/2020 1538   CHOLHDL 5.6 (H) 06/14/2020 1538   LDLCALC 123 (H) 06/14/2020 1538    Physical Exam:    Virtual Visit  ASSESSMENT:    1. 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  Concerns regarding medicines are outlined above.  No orders of the defined types were placed in this encounter.  No orders of the defined types were placed in this encounter.   There are no Patient Instructions on file for this visit.   Signed, Werner Lean, MD  12/19/2020 3:44 PM    [place]

## 2020-12-19 ENCOUNTER — Telehealth: Payer: Self-pay

## 2020-12-19 ENCOUNTER — Ambulatory Visit (INDEPENDENT_AMBULATORY_CARE_PROVIDER_SITE_OTHER): Payer: Medicaid Other | Admitting: Internal Medicine

## 2020-12-19 DIAGNOSIS — G7111 Myotonic muscular dystrophy: Secondary | ICD-10-CM | POA: Diagnosis not present

## 2020-12-19 NOTE — Telephone Encounter (Signed)
  Patient Consent for Virtual Visit   { RYKIN ROUTE has provided verbal consent on 12/19/2020 for a virtual visit (video or telephone).   CONSENT FOR VIRTUAL VISIT FOR:  Peter Steele  By participating in this virtual visit I agree to the following:  I hereby voluntarily request, consent and authorize [facility] and its employed or contracted physicians, physician assistants, nurse practitioners or other licensed health care professionals (the Practitioner), to provide me with telemedicine health care services (the "Services") as deemed necessary by the treating Practitioner. I acknowledge and consent to receive the Services by the Practitioner via telemedicine. I understand that the telemedicine visit will involve communicating with the Practitioner through live audiovisual communication technology and the disclosure of certain medical information by electronic transmission. I acknowledge that I have been given the opportunity to request an in-person assessment or other available alternative prior to the telemedicine visit and am voluntarily participating in the telemedicine visit.  I understand that I have the right to withhold or withdraw my consent to the use of telemedicine in the course of my care at any time, without affecting my right to future care or treatment, and that the Practitioner or I may terminate the telemedicine visit at any time. I understand that I have the right to inspect all information obtained and/or recorded in the course of the telemedicine visit and may receive copies of available information for a reasonable fee.  I understand that some of the potential risks of receiving the Services via telemedicine include:  Delay or interruption in medical evaluation due to technological equipment failure or disruption; Information transmitted may not be sufficient (e.g. poor resolution of images) to allow for appropriate medical decision making by the  Practitioner; and/or  In rare instances, security protocols could fail, causing a breach of personal health information.  Furthermore, I acknowledge that it is my responsibility to provide information about my medical history, conditions and care that is complete and accurate to the best of my ability. I acknowledge that Practitioner's advice, recommendations, and/or decision may be based on factors not within their control, such as incomplete or inaccurate data provided by me or distortions of diagnostic images or specimens that may result from electronic transmissions. I understand that the practice of medicine is not an exact science and that Practitioner makes no warranties or guarantees regarding treatment outcomes. I acknowledge that a copy of this consent can be made available to me via my patient portal ([facility]), or I can request a printed copy by calling the office of [facility].    I understand that my insurance will be billed for this visit.   I have read or had this consent read to me. I understand the contents of this consent, which adequately explains the benefits and risks of the Services being provided via telemedicine.  I have been provided ample opportunity to ask questions regarding this consent and the Services and have had my questions answered to my satisfaction. I give my informed consent for the services to be provided through the use of telemedicine in my medical care

## 2020-12-19 NOTE — Patient Instructions (Signed)
Medication Instructions:  Your physician recommends that you continue on your current medications as directed. Please refer to the Current Medication list given to you today.  *If you need a refill on your cardiac medications before your next appointment, please call your pharmacy*   Lab Work: NONE If you have labs (blood work) drawn today and your tests are completely normal, you will receive your results only by: [place] (if you have MyChart) OR A paper copy in the mail If you have any lab test that is abnormal or we need to change your treatment, we will call you to review the results.   Testing/Procedures: Your physician has requested that you have an EKG.  Your physician has requested that you have an echocardiogram. Echocardiography is a painless test that uses sound waves to create images of your heart. It provides your doctor with information about the size and shape of your heart and how well your heart's chambers and valves are working. This procedure takes approximately one hour. There are no restrictions for this procedure.    Follow-Up: At [facility], you and your health needs are our priority.  As part of our continuing mission to provide you with exceptional heart care, we have created designated Provider Care Teams.  These Care Teams include your primary Cardiologist (physician) and Advanced Practice Providers (APPs -  Physician Assistants and Nurse Practitioners) who all work together to provide you with the care you need, when you need it.   Your next appointment:   12 month(s)  The format for your next appointment:   In Person  Provider:   Rudean Haskell, MD

## 2020-12-20 ENCOUNTER — Other Ambulatory Visit: Payer: Self-pay

## 2020-12-29 ENCOUNTER — Ambulatory Visit: Payer: Medicaid Other | Admitting: Cardiology

## 2021-01-02 ENCOUNTER — Ambulatory Visit: Payer: Medicaid Other

## 2021-01-03 ENCOUNTER — Ambulatory Visit (INDEPENDENT_AMBULATORY_CARE_PROVIDER_SITE_OTHER): Payer: Medicaid Other

## 2021-01-03 ENCOUNTER — Other Ambulatory Visit: Payer: Self-pay

## 2021-01-03 VITALS — Ht 65.0 in | Wt 167.0 lb

## 2021-01-03 DIAGNOSIS — R9431 Abnormal electrocardiogram [ECG] [EKG]: Secondary | ICD-10-CM

## 2021-01-03 NOTE — Progress Notes (Signed)
Mr. Bagot is present today for a Nurse Visit- EKG per Dr. Gasper Sells.

## 2021-02-01 ENCOUNTER — Ambulatory Visit (HOSPITAL_COMMUNITY): Admission: RE | Admit: 2021-02-01 | Payer: Medicaid Other | Source: Ambulatory Visit

## 2021-02-28 DIAGNOSIS — N201 Calculus of ureter: Secondary | ICD-10-CM | POA: Diagnosis not present

## 2021-02-28 DIAGNOSIS — N200 Calculus of kidney: Secondary | ICD-10-CM | POA: Diagnosis not present

## 2021-03-29 ENCOUNTER — Other Ambulatory Visit: Payer: Self-pay

## 2021-03-29 ENCOUNTER — Encounter (HOSPITAL_COMMUNITY): Payer: Self-pay | Admitting: Emergency Medicine

## 2021-03-29 ENCOUNTER — Ambulatory Visit (HOSPITAL_COMMUNITY)
Admission: EM | Admit: 2021-03-29 | Discharge: 2021-03-29 | Disposition: A | Discharge status: Home / Self Care | Payer: Medicaid Other | Attending: Family Medicine | Admitting: Family Medicine

## 2021-03-29 ENCOUNTER — Ambulatory Visit (INDEPENDENT_AMBULATORY_CARE_PROVIDER_SITE_OTHER): Payer: Medicaid Other

## 2021-03-29 DIAGNOSIS — N200 Calculus of kidney: Secondary | ICD-10-CM

## 2021-03-29 NOTE — ED Triage Notes (Signed)
Patient requesting xrays related to kidney stones.  Patient says he saw a specialist and will not do ultrasound until xrays are done

## 2021-03-29 NOTE — Discharge Instructions (Addendum)
The x-ray today does show the kidney stone.  A disc is made of the images so you can take that to your urologist office.

## 2021-03-29 NOTE — ED Provider Notes (Addendum)
[facility]    CSN: [id number] Arrival date & time: 03/29/21  [id number]      History   Chief Complaint Chief Complaint  Patient presents with   requesting xrays    HPI Peter Steele is a 51 y.o. male.   HPI To obtain x-ray.  Patient was recently seen by urology on December 28.  As reviewed in the care everywhere tab of epic.  That provider ordered a 1 view x-ray abdomen.  Patient states today that he was told to go to a "walk-in" place to get the x-ray done.  So he is therefore presented here to our walk-in urgent care to obtain the x-ray.  I cannot tell that he has a paper order to take anywhere.  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   RHINITIS, ALLERGIC 05/01/2006    Past Surgical History:  Procedure Laterality Date   ADENOIDECTOMY     CHOLECYSTECTOMY     Can't remember year   COLONOSCOPY N/A 08/23/2020   Procedure: COLONOSCOPY;  Surgeon: Daneil Dolin, MD;  [facility];  Service: Endoscopy;  Laterality: N/A;  ASA II / 12:00   POLYPECTOMY  08/23/2020   Procedure: POLYPECTOMY;  Surgeon: Daneil Dolin, MD;  Location: [facility];  Service: Endoscopy;;       Home Medications    Prior to Admission medications   Not on File    Family History Family History  Problem Relation Age of Onset   Heart disease Mother    Stroke Mother    Early death Mother    Diabetes Father    Heart disease Father    Hyperlipidemia Father    High blood pressure Father    Asthma Sister    Heart disease Sister    Hyperlipidemia Sister     High blood pressure Sister     Social History Social History   Tobacco Use   Smoking status: Former    Types: Cigarettes    Quit date: 06/13/1995    Years since quitting: 25.8   Smokeless tobacco: Never  Vaping Use   Vaping Use: Never used  Substance Use Topics   Alcohol use: Not Currently   Drug use: Never     Allergies   Bee venom, Vinegar [acetic acid], and Doxycycline hyclate   Review of Systems Review of Systems   Physical Exam Triage Vital Signs ED Triage Vitals  Enc Vitals Group     BP 03/29/21 1011 115/86     Pulse Rate 03/29/21 1011 76     Resp 03/29/21 1011 20     Temp 03/29/21 1011 (!) 97.4 F (36.3 C)     Temp Source 03/29/21 1011 Oral     SpO2 03/29/21 1011 96 %     Weight --      Height --      Head Circumference --      Peak Flow --      Pain Score 03/29/21 1010 0     Pain Loc --      Pain  Edu? --      Excl. in [place]? --    No data found.  Updated Vital Signs BP 115/86 (BP Location: Left Arm)    Pulse 76    Temp (!) 97.4 F (36.3 C) (Oral)    Resp 20    SpO2 96%   Visual Acuity Right Eye Distance:   Left Eye Distance:   Bilateral Distance:    Right Eye Near:   Left Eye Near:    Bilateral Near:     Physical Exam Vitals reviewed.  Constitutional:      General: He is not in acute distress.    Appearance: He is not toxic-appearing.  Cardiovascular:     Rate and Rhythm: Normal rate and regular rhythm.  Pulmonary:     Effort: Pulmonary effort is normal.     Breath sounds: Normal breath sounds.  Skin:    Capillary Refill: Capillary refill takes less than 2 seconds.     Coloration: Skin is not jaundiced or pale.  Psychiatric:        Behavior: Behavior normal.     UC Treatments / Results  Labs (all labs ordered are listed, but only abnormal results are displayed) Labs Reviewed - No data to display  EKG   Radiology DG Abd 1 View  Result Date: 03/29/2021 CLINICAL DATA:  Nephrolithiasis. EXAM: ABDOMEN - 1 VIEW COMPARISON:   December 09, 2020. FINDINGS: The bowel gas pattern is normal. Stable 8 mm left renal calculus is noted. IMPRESSION: Stable left renal calculus.  No abnormal bowel dilatation. Electronically Signed   By: Marijo Conception M.D.   On: 03/29/2021 10:57    Procedures Procedures (including critical care time)  Medications Ordered in UC Medications - No data to display  Initial Impression / Assessment and Plan / UC Course  I have reviewed the triage vital signs and the nursing notes.  Pertinent labs & imaging results that were available during my care of the patient were reviewed by me and considered in my medical decision making (see chart for details).     KUB done here at his request. It shows the renal stone. Disc will be made for the pt of this xray to take to his urologist. Final Clinical Impressions(s) / UC Diagnoses   Final diagnoses:  Renal stone     Discharge Instructions      The x-ray today does show the kidney stone.  A disc is made of the images so you can take that to your urologist office.     ED Prescriptions   None    PDMP not reviewed this encounter.   Barrett Henle, MD 03/29/21 1106    Barrett Henle, MD 03/29/21 1106

## 2021-04-02 DIAGNOSIS — N132 Hydronephrosis with renal and ureteral calculous obstruction: Secondary | ICD-10-CM | POA: Diagnosis not present

## 2021-04-02 DIAGNOSIS — N1330 Unspecified hydronephrosis: Secondary | ICD-10-CM | POA: Diagnosis not present

## 2021-04-02 DIAGNOSIS — N200 Calculus of kidney: Secondary | ICD-10-CM | POA: Diagnosis not present

## 2021-04-02 DIAGNOSIS — N201 Calculus of ureter: Secondary | ICD-10-CM | POA: Diagnosis not present

## 2021-04-18 DIAGNOSIS — N200 Calculus of kidney: Secondary | ICD-10-CM | POA: Diagnosis not present

## 2021-05-22 DIAGNOSIS — Z20822 Contact with and (suspected) exposure to covid-19: Secondary | ICD-10-CM | POA: Diagnosis not present

## 2021-05-23 DIAGNOSIS — Z9049 Acquired absence of other specified parts of digestive tract: Secondary | ICD-10-CM | POA: Diagnosis not present

## 2021-05-23 DIAGNOSIS — N200 Calculus of kidney: Secondary | ICD-10-CM | POA: Diagnosis not present

## 2021-06-01 DIAGNOSIS — N200 Calculus of kidney: Secondary | ICD-10-CM | POA: Diagnosis not present

## 2021-06-01 DIAGNOSIS — Z09 Encounter for follow-up examination after completed treatment for conditions other than malignant neoplasm: Secondary | ICD-10-CM | POA: Diagnosis not present

## 2021-08-07 ENCOUNTER — Encounter: Payer: Self-pay | Admitting: *Deleted

## 2021-09-12 ENCOUNTER — Encounter (INDEPENDENT_AMBULATORY_CARE_PROVIDER_SITE_OTHER): Payer: Medicaid Other | Admitting: Ophthalmology

## 2022-04-12 ENCOUNTER — Encounter: Payer: Self-pay | Admitting: Cardiology

## 2022-04-12 ENCOUNTER — Ambulatory Visit: Payer: Medicaid Other | Attending: Cardiology | Admitting: Cardiology

## 2022-04-12 VITALS — BP 120/72 | HR 66 | Ht 65.0 in | Wt 172.0 lb

## 2022-04-12 DIAGNOSIS — R9431 Abnormal electrocardiogram [ECG] [EKG]: Secondary | ICD-10-CM

## 2022-04-12 DIAGNOSIS — G7111 Myotonic muscular dystrophy: Secondary | ICD-10-CM

## 2022-04-12 NOTE — Addendum Note (Signed)
Addended by: Levonne Hubert on: 04/12/2022 04:47 PM   Modules accepted: Orders

## 2022-04-12 NOTE — Progress Notes (Signed)
Cardiology Clinic Note   Patient Name: Peter Steele Date of Encounter: 04/12/2022  Primary Care Provider:  Orvis Brill, DO Primary Cardiologist:  Donato Heinz, MD  Patient Profile    52 year old male with a history of myotonic muscular dystrophy and abnormal EKG who is here today for follow-up.  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[place])    Followed by [facility]rology for this   Past Surgical History:  Procedure Laterality Date   ADENOIDECTOMY     CHOLECYSTECTOMY     Can't remember year   COLONOSCOPY N/A 08/23/2020   Procedure: COLONOSCOPY;  Surgeon: Daneil Dolin, MD;  [facility];  Service: Endoscopy;  Laterality: N/A;  ASA II / 12:00   POLYPECTOMY  08/23/2020   Procedure: POLYPECTOMY;  Surgeon: Daneil Dolin, MD;  [facility];  Service: Endoscopy;;    Allergies  Allergies  Allergen Reactions   Bee Venom Anaphylaxis and Swelling   Vinegar [Acetic Acid] Anaphylaxis   Doxycycline Hyclate Nausea And Vomiting and Other (See Comments)    Gi upset    History of Present Illness    Peter Steele is a 52 year old male with a history of myotonic muscular dystrophy,  who was originally referred for evaluation of cardiac involvement of his muscular dystrophy.  Echocardiogram completed 07/2019 revealed LVEF of 60-65%, no regional wall motion abnormalities, and no valvular abnormalities.  He was last seen via virtual visit 12/19/2020 by Dr. Gasper Sells.  At that time patient stated he was doing okay.  He had been suffering with kidney stones and was going to be undergoing therapy on the 31st.  There were no medications initiated or studies ordered at that time.  He returns to clinic today stating that overall he has been doing well.  He has had no further bouts of kidney stones but he has had issues with gallstones in the past that he states were worse.  He does note  that his 64 year old sister passed away as she has muscular dystrophy as well.  He denies any chest pain, shortness of breath, dyspnea on exertion, peripheral edema, palpitations, lightheadedness/dizziness.  He also denies any recent hospitalizations or emergency department visits.  Home Medications    No current outpatient medications on file.   No current facility-administered medications for this visit.     Family History    Family History  Problem Relation Age of Onset   Heart disease Mother    Stroke Mother    Early death Mother    Diabetes Father    Heart disease Father    Hyperlipidemia Father    High blood pressure Father    Asthma Sister    Heart disease Sister    Hyperlipidemia Sister    High blood pressure Sister    He indicated that the status of his mother is unknown. He indicated that the status of his father is unknown. He indicated that the status of his sister is unknown.  Social History    Social History   Socioeconomic History   Marital status: Single    Spouse name: Not on file   Number of children: Not on file   Years of education: Not on file   Highest education level: Not on file  Occupational History   Occupation: not employed  Tobacco Use   Smoking status: Former    Types: Cigarettes    Quit date: 06/13/1995  Years since quitting: 26.8   Smokeless tobacco: Never  Vaping Use   Vaping Use: Never used  Substance and Sexual Activity   Alcohol use: Not Currently   Drug use: Never   Sexual activity: Not Currently    Comment: Girlfriend died 5 years ago. not active since  Other Topics Concern   Not on file  Social History Narrative   Not on file   Social Determinants of Health   Financial Resource Strain: Not on file  Food Insecurity: Not on file  Transportation Needs: Not on file  Physical Activity: Unknown (04/16/2018)   Exercise Vital Sign    Days of Exercise per Week: 0 days    Minutes of Exercise per Session: Not on file   Stress: Not on file  Social Connections: Not on file  Intimate Partner Violence: Not on file     Review of Systems    General:  No chills, fever, night sweats or weight changes.  Cardiovascular:  No chest pain, dyspnea on exertion, edema, orthopnea, palpitations, paroxysmal nocturnal dyspnea. Dermatological: No rash, lesions/masses Respiratory: No cough, dyspnea Urologic: No hematuria, dysuria Abdominal:   No nausea, vomiting, diarrhea, bright red blood per rectum, melena, or hematemesis Neurologic:  No visual changes, wkns, changes in mental status. All other systems reviewed and are otherwise negative except as noted above.   Physical Exam    VS:  BP 120/72   Pulse 66   Ht 5' 5"$  (1.651 m)   Wt 172 lb (78 kg)   SpO2 96%   BMI 28.62 kg/m  , BMI Body mass index is 28.62 kg/m.     GEN: Well nourished, well developed, in no acute distress. HEENT: normal. Neck: Supple, no JVD, carotid bruits, or masses. Cardiac: RRR, no murmurs, rubs, or gallops. No clubbing, cyanosis, edema.  Radials 2+/PT 2+ and equal bilaterally.  Respiratory:  Respirations regular and unlabored, clear to auscultation bilaterally. GI: Soft, nontender, nondistended, BS + x 4. MS: no deformity or atrophy. Skin: warm and dry, no rash. Neuro:  Strength and sensation are intact. Psych: Normal affect.  Accessory Clinical Findings    ECG personally reviewed by me today-sinus rhythm with sinus arrhythmia with a rate of 69, early repolarization, and nonspecific intraventricular conduction delay- No acute changes  Lab Results  Component Value Date   WBC 7.5 05/21/2019   HGB 16.0 05/21/2019   HCT 48.5 05/21/2019   MCV 88 05/21/2019   PLT 221 05/21/2019   Lab Results  Component Value Date   CREATININE 0.85 06/14/2020   BUN 15 06/14/2020   NA 143 06/14/2020   K 4.4 06/14/2020   CL 105 06/14/2020   CO2 20 06/14/2020   Lab Results  Component Value Date   ALT 29 06/14/2020   AST 22 06/14/2020   ALKPHOS  96 06/14/2020   BILITOT 0.7 06/14/2020   Lab Results  Component Value Date   CHOL 214 (H) 06/14/2020   HDL 38 (L) 06/14/2020   LDLCALC 123 (H) 06/14/2020   TRIG 298 (H) 06/14/2020   CHOLHDL 5.6 (H) 06/14/2020    Lab Results  Component Value Date   HGBA1C 5.3 04/16/2018    Assessment & Plan   1.  Myotonic muscular dystrophy without any current cardiac symptoms or symptoms of decompensation.  This disorder has been associated with increased risk of cardiomyopathy and conduction disorders and arrhythmias. Last echocardiogram he had in 2021 was unrevealing as well.  On return he will need repeat echocardiogram for surveillance studies  for every 3 to 5 years.  Patient has been advised of this as well as yearly EKGs.  He currently remains on no medications.  2. Abnormal EKG  today revealed sinus rhythm and sinus arrhythmia with early repull and a nonspecific IVCD.  Is considered an abnormal study but is unchanged from prior studies that he has had.  3.  Disposition patient return to clinic to see MD/APP in 1 year or sooner if needed  Kegan Shepardson, NP 04/12/2022, 4:20 PM

## 2022-04-12 NOTE — Patient Instructions (Signed)
Medication Instructions:  Your physician recommends that you continue on your current medications as directed. Please refer to the Current Medication list given to you today.  *If you need a refill on your cardiac medications before your next appointment, please call your pharmacy*   Lab Work: NONE  If you have labs (blood work) drawn today and your tests are completely normal, you will receive your results only by: [place] (if you have MyChart) OR A paper copy in the mail If you have any lab test that is abnormal or we need to change your treatment, we will call you to review the results.   Testing/Procedures: NONE    Follow-Up: At [facility], you and your health needs are our priority.  As part of our continuing mission to provide you with exceptional heart care, we have created designated Provider Care Teams.  These Care Teams include your primary Cardiologist (physician) and Advanced Practice Providers (APPs -  Physician Assistants and Nurse Practitioners) who all work together to provide you with the care you need, when you need it.  We recommend signing up for the patient portal called "MyChart".  Sign up information is provided on this After Visit Summary.  MyChart is used to connect with patients for Virtual Visits (Telemedicine).  Patients are able to view lab/test results, encounter notes, upcoming appointments, etc.  Non-urgent messages can be sent to your provider as well.   To learn more about what you can do with MyChart, go to [email or web address].    Your next appointment:   1 year(s)  Provider:   Claudina Lick, MD    Other Instructions Thank you for choosing [facility]!

## 2023-01-31 ENCOUNTER — Emergency Department (HOSPITAL_COMMUNITY): Payer: Medicaid Other

## 2023-01-31 ENCOUNTER — Other Ambulatory Visit: Payer: Self-pay

## 2023-01-31 ENCOUNTER — Inpatient Hospital Stay (HOSPITAL_COMMUNITY)
Admission: EM | Admit: 2023-01-31 | Discharge: 2023-02-02 | DRG: 296 | Disposition: E | Payer: Medicaid Other | Attending: Pulmonary Disease | Admitting: Pulmonary Disease

## 2023-01-31 DIAGNOSIS — J9602 Acute respiratory failure with hypercapnia: Secondary | ICD-10-CM | POA: Diagnosis present

## 2023-01-31 DIAGNOSIS — Z91018 Allergy to other foods: Secondary | ICD-10-CM

## 2023-01-31 DIAGNOSIS — Z66 Do not resuscitate: Secondary | ICD-10-CM | POA: Diagnosis present

## 2023-01-31 DIAGNOSIS — I4891 Unspecified atrial fibrillation: Secondary | ICD-10-CM | POA: Diagnosis present

## 2023-01-31 DIAGNOSIS — D689 Coagulation defect, unspecified: Secondary | ICD-10-CM | POA: Diagnosis present

## 2023-01-31 DIAGNOSIS — D696 Thrombocytopenia, unspecified: Secondary | ICD-10-CM | POA: Diagnosis present

## 2023-01-31 DIAGNOSIS — K7200 Acute and subacute hepatic failure without coma: Secondary | ICD-10-CM | POA: Diagnosis present

## 2023-01-31 DIAGNOSIS — Z79899 Other long term (current) drug therapy: Secondary | ICD-10-CM

## 2023-01-31 DIAGNOSIS — E875 Hyperkalemia: Secondary | ICD-10-CM | POA: Diagnosis present

## 2023-01-31 DIAGNOSIS — E871 Hypo-osmolality and hyponatremia: Secondary | ICD-10-CM | POA: Diagnosis present

## 2023-01-31 DIAGNOSIS — J9601 Acute respiratory failure with hypoxia: Secondary | ICD-10-CM | POA: Diagnosis present

## 2023-01-31 DIAGNOSIS — J690 Pneumonitis due to inhalation of food and vomit: Secondary | ICD-10-CM | POA: Diagnosis present

## 2023-01-31 DIAGNOSIS — E872 Acidosis, unspecified: Secondary | ICD-10-CM | POA: Diagnosis present

## 2023-01-31 DIAGNOSIS — Z87891 Personal history of nicotine dependence: Secondary | ICD-10-CM

## 2023-01-31 DIAGNOSIS — Z833 Family history of diabetes mellitus: Secondary | ICD-10-CM

## 2023-01-31 DIAGNOSIS — Z8249 Family history of ischemic heart disease and other diseases of the circulatory system: Secondary | ICD-10-CM

## 2023-01-31 DIAGNOSIS — N179 Acute kidney failure, unspecified: Secondary | ICD-10-CM | POA: Diagnosis present

## 2023-01-31 DIAGNOSIS — Z8241 Family history of sudden cardiac death: Secondary | ICD-10-CM

## 2023-01-31 DIAGNOSIS — Z888 Allergy status to other drugs, medicaments and biological substances status: Secondary | ICD-10-CM

## 2023-01-31 DIAGNOSIS — R570 Cardiogenic shock: Secondary | ICD-10-CM | POA: Diagnosis present

## 2023-01-31 DIAGNOSIS — R579 Shock, unspecified: Secondary | ICD-10-CM

## 2023-01-31 DIAGNOSIS — I309 Acute pericarditis, unspecified: Secondary | ICD-10-CM | POA: Diagnosis present

## 2023-01-31 DIAGNOSIS — I5021 Acute systolic (congestive) heart failure: Secondary | ICD-10-CM | POA: Diagnosis present

## 2023-01-31 DIAGNOSIS — G931 Anoxic brain damage, not elsewhere classified: Secondary | ICD-10-CM | POA: Diagnosis present

## 2023-01-31 DIAGNOSIS — Z91030 Bee allergy status: Secondary | ICD-10-CM

## 2023-01-31 DIAGNOSIS — Z823 Family history of stroke: Secondary | ICD-10-CM

## 2023-01-31 DIAGNOSIS — Z7189 Other specified counseling: Secondary | ICD-10-CM

## 2023-01-31 DIAGNOSIS — Z825 Family history of asthma and other chronic lower respiratory diseases: Secondary | ICD-10-CM

## 2023-01-31 DIAGNOSIS — G7111 Myotonic muscular dystrophy: Secondary | ICD-10-CM | POA: Diagnosis present

## 2023-01-31 DIAGNOSIS — I469 Cardiac arrest, cause unspecified: Principal | ICD-10-CM | POA: Diagnosis present

## 2023-01-31 DIAGNOSIS — J930 Spontaneous tension pneumothorax: Secondary | ICD-10-CM | POA: Diagnosis present

## 2023-01-31 DIAGNOSIS — R739 Hyperglycemia, unspecified: Secondary | ICD-10-CM | POA: Diagnosis present

## 2023-01-31 LAB — TROPONIN I (HIGH SENSITIVITY): Troponin I (High Sensitivity): 241 ng/L (ref <18)

## 2023-01-31 LAB — CBC WITH DIFFERENTIAL/PLATELET
Abs Immature Granulocytes: 1.27 10*3/uL — ABNORMAL HIGH (ref 0.00–0.07)
Basophils Absolute: 0.2 10*3/uL — ABNORMAL HIGH (ref 0.0–0.1)
Basophils Relative: 1 %
Eosinophils Absolute: 0 10*3/uL (ref 0.0–0.5)
Eosinophils Relative: 0 %
HCT: 44.6 % (ref 39.0–52.0)
Hemoglobin: 13.3 g/dL (ref 13.0–17.0)
Immature Granulocytes: 4 %
Lymphocytes Relative: 10 %
Lymphs Abs: 2.8 10*3/uL (ref 0.7–4.0)
MCH: 29.4 pg (ref 26.0–34.0)
MCHC: 29.8 g/dL — ABNORMAL LOW (ref 30.0–36.0)
MCV: 98.7 fL (ref 80.0–100.0)
Monocytes Absolute: 2.1 10*3/uL — ABNORMAL HIGH (ref 0.1–1.0)
Monocytes Relative: 7 %
Neutro Abs: 22.5 10*3/uL — ABNORMAL HIGH (ref 1.7–7.7)
Neutrophils Relative %: 78 %
Platelets: 66 10*3/uL — ABNORMAL LOW (ref 150–400)
RBC: 4.52 MIL/uL (ref 4.22–5.81)
RDW: 14.7 % (ref 11.5–15.5)
Smear Review: DECREASED
WBC Morphology: INCREASED
WBC: 28.8 10*3/uL — ABNORMAL HIGH (ref 4.0–10.5)
nRBC: 0 % (ref 0.0–0.2)

## 2023-01-31 LAB — I-STAT VENOUS BLOOD GAS, ED
Acid-base deficit: 23 mmol/L — ABNORMAL HIGH (ref 0.0–2.0)
Bicarbonate: 12.2 mmol/L — ABNORMAL LOW (ref 20.0–28.0)
Calcium, Ion: 1.03 mmol/L — ABNORMAL LOW (ref 1.15–1.40)
HCT: 43 % (ref 39.0–52.0)
Hemoglobin: 14.6 g/dL (ref 13.0–17.0)
O2 Saturation: 47 %
Potassium: 7.4 mmol/L (ref 3.5–5.1)
Sodium: 131 mmol/L — ABNORMAL LOW (ref 135–145)
TCO2: 14 mmol/L — ABNORMAL LOW (ref 22–32)
pCO2, Ven: 75.6 mm[Hg] (ref 44–60)
pH, Ven: 6.816 — CL (ref 7.25–7.43)
pO2, Ven: 47 mm[Hg] — ABNORMAL HIGH (ref 32–45)

## 2023-01-31 LAB — I-STAT CG4 LACTIC ACID, ED: Lactic Acid, Venous: 13.3 mmol/L (ref 0.5–1.9)

## 2023-01-31 LAB — I-STAT CHEM 8, ED
BUN: 63 mg/dL — ABNORMAL HIGH (ref 6–20)
Calcium, Ion: 1.03 mmol/L — ABNORMAL LOW (ref 1.15–1.40)
Chloride: 105 mmol/L (ref 98–111)
Creatinine, Ser: 2.8 mg/dL — ABNORMAL HIGH (ref 0.61–1.24)
Glucose, Bld: 193 mg/dL — ABNORMAL HIGH (ref 70–99)
HCT: 45 % (ref 39.0–52.0)
Hemoglobin: 15.3 g/dL (ref 13.0–17.0)
Potassium: 7.5 mmol/L (ref 3.5–5.1)
Sodium: 131 mmol/L — ABNORMAL LOW (ref 135–145)
TCO2: 16 mmol/L — ABNORMAL LOW (ref 22–32)

## 2023-01-31 LAB — CBG MONITORING, ED: Glucose-Capillary: 175 mg/dL — ABNORMAL HIGH (ref 70–99)

## 2023-01-31 MED ORDER — EPINEPHRINE HCL 5 MG/250ML IV SOLN IN NS
0.5000 ug/min | INTRAVENOUS | Status: DC
  Administered 2023-01-31: 0.5 ug/min via INTRAVENOUS
  Administered 2023-02-01 (×2): 20 ug/min via INTRAVENOUS
  Filled 2023-01-31 (×4): qty 250

## 2023-01-31 MED ORDER — NOREPINEPHRINE 4 MG/250ML-% IV SOLN
0.0000 ug/min | INTRAVENOUS | Status: DC
  Administered 2023-01-31 – 2023-02-01 (×2): 40 ug/min via INTRAVENOUS
  Administered 2023-02-01: 60 ug/min via INTRAVENOUS
  Administered 2023-02-01: 58 ug/min via INTRAVENOUS
  Administered 2023-02-01 (×4): 60 ug/min via INTRAVENOUS
  Filled 2023-01-31 (×2): qty 250
  Filled 2023-01-31: qty 500
  Filled 2023-01-31: qty 250
  Filled 2023-01-31 (×2): qty 500
  Filled 2023-01-31: qty 250

## 2023-01-31 MED ORDER — ALBUTEROL SULFATE (2.5 MG/3ML) 0.083% IN NEBU
10.0000 mg | INHALATION_SOLUTION | Freq: Once | RESPIRATORY_TRACT | Status: AC
  Administered 2023-01-31: 10 mg via RESPIRATORY_TRACT
  Filled 2023-01-31: qty 12

## 2023-01-31 MED ORDER — SODIUM BICARBONATE 8.4 % IV SOLN
INTRAVENOUS | Status: AC | PRN
  Administered 2023-01-31: 50 meq via INTRAVENOUS

## 2023-01-31 MED ORDER — CALCIUM GLUCONATE 10 % IV SOLN
1.0000 g | Freq: Once | INTRAVENOUS | Status: AC
  Administered 2023-01-31: 1 g via INTRAVENOUS

## 2023-01-31 MED ORDER — SODIUM CHLORIDE 0.9 % IV BOLUS
1000.0000 mL | Freq: Once | INTRAVENOUS | Status: AC
  Administered 2023-01-31: 1000 mL via INTRAVENOUS

## 2023-01-31 MED ORDER — VASOPRESSIN 20 UNITS/100 ML INFUSION FOR SHOCK
0.0000 [IU]/min | INTRAVENOUS | Status: DC
  Administered 2023-01-31 – 2023-02-01 (×2): 0.03 [IU]/min via INTRAVENOUS
  Filled 2023-01-31: qty 100

## 2023-01-31 MED ORDER — DEXTROSE 50 % IV SOLN
1.0000 | Freq: Once | INTRAVENOUS | Status: AC
  Administered 2023-01-31: 50 mL via INTRAVENOUS

## 2023-01-31 MED ORDER — CALCIUM CHLORIDE 10 % IV SOLN
INTRAVENOUS | Status: AC | PRN
  Administered 2023-01-31: 1 g via INTRAVENOUS

## 2023-01-31 MED ORDER — INSULIN ASPART 100 UNIT/ML IV SOLN
5.0000 [IU] | Freq: Once | INTRAVENOUS | Status: AC
  Administered 2023-01-31: 5 [IU] via INTRAVENOUS

## 2023-01-31 MED ORDER — STERILE WATER FOR INJECTION IV SOLN
INTRAVENOUS | Status: DC
  Filled 2023-01-31 (×2): qty 150

## 2023-01-31 MED ORDER — SODIUM BICARBONATE 8.4 % IV SOLN
50.0000 meq | Freq: Once | INTRAVENOUS | Status: AC
Start: 2023-01-31 — End: 2023-01-31
  Administered 2023-01-31: 50 meq via INTRAVENOUS

## 2023-01-31 MED ORDER — EPINEPHRINE 1 MG/10ML IJ SOSY
PREFILLED_SYRINGE | INTRAMUSCULAR | Status: AC | PRN
  Administered 2023-01-31 (×2): 1 mg via INTRAVENOUS

## 2023-01-31 NOTE — ED Triage Notes (Signed)
Pt presents via EMS post CPR with ROSC.   EMS reports.  CPR initiated at 2106, apx downtime  ROSC achieved at 2126 CPR 2153 ROSC 2157   Arrived to ED 2213.   CPR started again at 2216

## 2023-01-31 NOTE — ED Notes (Signed)
Cardioverted with 200J with Adela Lank DO at bedside.

## 2023-01-31 NOTE — ED Notes (Signed)
Pulses present on assessment.

## 2023-01-31 NOTE — ED Provider Notes (Incomplete)
[place] EMERGENCY DEPARTMENT AT [facility] Provider Note   CSN: [id number] Arrival date & time: 01/31/23  2220     History {Add pertinent medical, surgical, social history, OB history to HPI:1} Chief Complaint  Patient presents with   CPR   Peter Steele is a 52 y.o. male with a PMH of myotonic muscular dystrophy who presented to the ED for cardiac arrest.  Per EMS, they report the patient was eating dinner when he slumped over and became unresponsive.  Family called 911 and he reportedly received approximately 5 minutes of bystander CPR until EMS arrived.  EMS reports they had an initial rhythm of asystole and achieved ROSC after approximately 20 minutes.  They intubated the patient in the field.  They report he became pulseless again and received approximately 4 minutes of CPR prior to ROSC again.  Per his sister via phone call, they report the patient was recently diagnosed with a kidney stone and is taking medication for that, but has had no other recent problems.  She reports they have had multiple family members die from myotonic muscular dystrophy and cardiac complications related to the disease.  HPI     Home Medications Prior to Admission medications   Not on File      Allergies    Bee venom, Vinegar [acetic acid], and Doxycycline hyclate    Review of Systems   Review of Systems  Physical Exam Updated Vital Signs There were no vitals taken for this visit. Physical Exam  ED Results / Procedures / Treatments   Labs (all labs ordered are listed, but only abnormal results are displayed) Labs Reviewed  CBC WITH DIFFERENTIAL/PLATELET - Abnormal; Notable for the following components:      Result Value   WBC 28.8 (*)    MCHC 29.8 (*)    Platelets 66 (*)    Neutro Abs 22.5 (*)    Monocytes Absolute 2.1 (*)    Basophils Absolute 0.2 (*)    Abs Immature Granulocytes 1.27 (*)    All other components within normal limits  I-STAT VENOUS BLOOD GAS, ED  - Abnormal; Notable for the following components:   pH, Ven 6.816 (*)    pCO2, Ven 75.6 (*)    pO2, Ven 47 (*)    Bicarbonate 12.2 (*)    TCO2 14 (*)    Acid-base deficit 23.0 (*)    Sodium 131 (*)    Potassium 7.4 (*)    Calcium, Ion 1.03 (*)    All other components within normal limits  I-STAT CHEM 8, ED - Abnormal; Notable for the following components:   Sodium 131 (*)    Potassium 7.5 (*)    BUN 63 (*)    Creatinine, Ser 2.80 (*)    Glucose, Bld 193 (*)    Calcium, Ion 1.03 (*)    TCO2 16 (*)    All other components within normal limits  I-STAT CG4 LACTIC ACID, ED - Abnormal; Notable for the following components:   Lactic Acid, Venous 13.3 (*)    All other components within normal limits  CBG MONITORING, ED - Abnormal; Notable for the following components:   Glucose-Capillary 175 (*)    All other components within normal limits  TROPONIN I (HIGH SENSITIVITY) - Abnormal; Notable for the following components:   Troponin I (High Sensitivity) 241 (*)    All other components within normal limits  COMPREHENSIVE METABOLIC PANEL  I-STAT VENOUS BLOOD GAS, ED    EKG EKG  Interpretation Date/Time:  Friday January 31 2023 22:39:54 EST Ventricular Rate:  127 PR Interval:  194 QRS Duration:  120 QT Interval:  316 QTC Calculation: 460 R Axis:   49  Text Interpretation: Sinus tachycardia Borderline prolonged PR interval Left atrial enlargement Incomplete left bundle branch block ST elevation suggests acute pericarditis No significant change since last tracing Confirmed by Melene Plan [phone]) on 01/31/2023 11:08:54 PM  Radiology DG Chest Portable 1 View  Result Date: 01/31/2023 CLINICAL DATA:  post cpr EXAM: PORTABLE CHEST 1 VIEW COMPARISON:  Chest x-ray 03/01/2009 FINDINGS: Patient is rotated. Possible right mainstem bronchi intubation. Enteric tube courses below the hemidiaphragm with tip and side port overlying the expected region the gastric lumen. The heart and mediastinal  contours are grossly unremarkable in the setting of low lung volumes. Low lung volumes. Bibasilar atelectasis. No focal consolidation. No pulmonary edema. No pleural effusion. No pneumothorax. No acute osseous abnormality. Gaseous distension of the gastric lumen. Right upper quadrant surgical clips. IMPRESSION: 1. Poss right mainstem bronchi intubation in the setting low lung volumes and patient rotation. Recommend retracting by 1cm and repeating radiograph with improved inspiratory effort. 2. Enteric tube in good position. 3. Gaseous distension of the gastric lumen. Electronically Signed   By: Tish Frederickson M.D.   On: 01/31/2023 23:02    Procedures Procedures  {Document cardiac monitor, telemetry assessment procedure when appropriate:1}  Medications Ordered in ED Medications  norepinephrine (LEVOPHED) 4mg  in (0.016 mg/mL) premix infusion (40 mcg/min Intravenous New Bag/Given 01/31/23 2228)  vasopressin (PITRESSIN) 20 Units in 100 mL (0.2 unit/mL) infusion-*FOR SHOCK* (0.03 Units/min Intravenous New Bag/Given 01/31/23 2251)  sodium bicarbonate 150 mEq in sterile water 1,150 mL infusion (has no administration in time range)  EPINEPHrine (ADRENALIN) 5 mg in NS 250 mL (0.02 mg/mL) premix infusion (12 mcg/min Intravenous Rate/Dose Change 01/31/23 2323)  EPINEPHrine (ADRENALIN) 1 MG/10ML injection (1 mg Intravenous Given 01/31/23 2255)  calcium chloride injection (1 g Intravenous Given 01/31/23 2223)  sodium bicarbonate injection (50 mEq Intravenous Given 01/31/23 2224)  insulin aspart (novoLOG) injection 5 Units (5 Units Intravenous Given 01/31/23 2257)    And  dextrose 50 % solution 50 mL (50 mLs Intravenous Given 01/31/23 2257)  albuterol (PROVENTIL) (2.5 MG/3ML) 0.083% nebulizer solution 10 mg (10 mg Nebulization Given 01/31/23 2326)  calcium gluconate inj 10% (1 g) URGENT USE ONLY! (1 g Intravenous Given 01/31/23 2257)  sodium bicarbonate injection 50 mEq (50 mEq Intravenous Given  01/31/23 2256)  sodium chloride 0.9 % bolus 1,000 mL (1,000 mLs Intravenous New Bag/Given 01/31/23 2213)    ED Course/ Medical Decision Making/ A&P   {   Click here for ABCD2, HEART and other calculatorsREFRESH Note before signing :1}                              Medical Decision Making Amount and/or Complexity of Data Reviewed Labs: ordered. Radiology: ordered.  Risk OTC drugs. Prescription drug management.   ***  {Document critical care time when appropriate:1} {Document review of labs and clinical decision tools ie heart score, Chads2Vasc2 etc:1}  {Document your independent review of radiology images, and any outside records:1} {Document your discussion with family members, caretakers, and with consultants:1} {Document social determinants of health affecting pt's care:1} {Document your decision making why or why not admission, treatments were needed:1} Final Clinical Impression(s) / ED Diagnoses Final diagnoses:  None    Rx / DC Orders ED Discharge Orders  None

## 2023-01-31 NOTE — ED Notes (Signed)
Pulses present on palpation.

## 2023-01-31 NOTE — ED Notes (Signed)
Pulses present on doppler.

## 2023-01-31 NOTE — Progress Notes (Signed)
ETT pulled back to 23 cm at the teeth per MD

## 2023-01-31 NOTE — ED Notes (Signed)
Pulses not present on assessment. CPR restarted.

## 2023-01-31 NOTE — ED Notes (Signed)
CPR started pulses not present on palpation. PEA.

## 2023-01-31 NOTE — Progress Notes (Signed)
   01/31/23 2334  Spiritual Encounters  Care provided to: [facility] partners present during encounter Nurse;Physician  Reason for visit Routine spiritual support  OnCall Visit Yes   Responded to page, located and escorted family to consult room A. Per doctor escorted family to trauma b to discuss plan of care, patient being moved to ICU. Present mother, father, sister and brother in law.

## 2023-01-31 NOTE — ED Notes (Signed)
Pulses not palpated. CPR restarted.

## 2023-02-01 ENCOUNTER — Inpatient Hospital Stay: Payer: Self-pay

## 2023-02-01 ENCOUNTER — Ambulatory Visit (HOSPITAL_COMMUNITY): Admission: RE | Admit: 2023-02-01 | Payer: Medicaid Other | Source: Ambulatory Visit

## 2023-02-01 ENCOUNTER — Inpatient Hospital Stay (HOSPITAL_COMMUNITY): Payer: Medicaid Other

## 2023-02-01 ENCOUNTER — Other Ambulatory Visit: Payer: Self-pay

## 2023-02-01 DIAGNOSIS — K7200 Acute and subacute hepatic failure without coma: Secondary | ICD-10-CM

## 2023-02-01 DIAGNOSIS — E872 Acidosis, unspecified: Secondary | ICD-10-CM

## 2023-02-01 DIAGNOSIS — J9601 Acute respiratory failure with hypoxia: Secondary | ICD-10-CM

## 2023-02-01 DIAGNOSIS — Z8249 Family history of ischemic heart disease and other diseases of the circulatory system: Secondary | ICD-10-CM | POA: Diagnosis not present

## 2023-02-01 DIAGNOSIS — J690 Pneumonitis due to inhalation of food and vomit: Secondary | ICD-10-CM | POA: Diagnosis present

## 2023-02-01 DIAGNOSIS — Z87891 Personal history of nicotine dependence: Secondary | ICD-10-CM | POA: Diagnosis not present

## 2023-02-01 DIAGNOSIS — J9602 Acute respiratory failure with hypercapnia: Secondary | ICD-10-CM

## 2023-02-01 DIAGNOSIS — G9340 Encephalopathy, unspecified: Secondary | ICD-10-CM

## 2023-02-01 DIAGNOSIS — Z7189 Other specified counseling: Secondary | ICD-10-CM

## 2023-02-01 DIAGNOSIS — G931 Anoxic brain damage, not elsewhere classified: Secondary | ICD-10-CM | POA: Diagnosis present

## 2023-02-01 DIAGNOSIS — I469 Cardiac arrest, cause unspecified: Principal | ICD-10-CM | POA: Diagnosis present

## 2023-02-01 DIAGNOSIS — Z825 Family history of asthma and other chronic lower respiratory diseases: Secondary | ICD-10-CM | POA: Diagnosis not present

## 2023-02-01 DIAGNOSIS — R569 Unspecified convulsions: Secondary | ICD-10-CM

## 2023-02-01 DIAGNOSIS — J930 Spontaneous tension pneumothorax: Secondary | ICD-10-CM

## 2023-02-01 DIAGNOSIS — E875 Hyperkalemia: Secondary | ICD-10-CM | POA: Diagnosis present

## 2023-02-01 DIAGNOSIS — I309 Acute pericarditis, unspecified: Secondary | ICD-10-CM | POA: Diagnosis present

## 2023-02-01 DIAGNOSIS — D696 Thrombocytopenia, unspecified: Secondary | ICD-10-CM | POA: Diagnosis present

## 2023-02-01 DIAGNOSIS — D689 Coagulation defect, unspecified: Secondary | ICD-10-CM | POA: Diagnosis present

## 2023-02-01 DIAGNOSIS — I4891 Unspecified atrial fibrillation: Secondary | ICD-10-CM | POA: Diagnosis present

## 2023-02-01 DIAGNOSIS — I5021 Acute systolic (congestive) heart failure: Secondary | ICD-10-CM | POA: Diagnosis present

## 2023-02-01 DIAGNOSIS — Z66 Do not resuscitate: Secondary | ICD-10-CM

## 2023-02-01 DIAGNOSIS — R579 Shock, unspecified: Secondary | ICD-10-CM

## 2023-02-01 DIAGNOSIS — G7111 Myotonic muscular dystrophy: Secondary | ICD-10-CM | POA: Diagnosis present

## 2023-02-01 DIAGNOSIS — Z79899 Other long term (current) drug therapy: Secondary | ICD-10-CM | POA: Diagnosis not present

## 2023-02-01 DIAGNOSIS — E871 Hypo-osmolality and hyponatremia: Secondary | ICD-10-CM | POA: Diagnosis present

## 2023-02-01 DIAGNOSIS — N179 Acute kidney failure, unspecified: Secondary | ICD-10-CM

## 2023-02-01 DIAGNOSIS — R570 Cardiogenic shock: Secondary | ICD-10-CM | POA: Diagnosis present

## 2023-02-01 LAB — COMPREHENSIVE METABOLIC PANEL
ALT: 39 U/L (ref 0–44)
ALT: 45 U/L — ABNORMAL HIGH (ref 0–44)
AST: 84 U/L — ABNORMAL HIGH (ref 15–41)
AST: 121 U/L — ABNORMAL HIGH (ref 15–41)
Albumin: 2.1 g/dL — ABNORMAL LOW (ref 3.5–5.0)
Albumin: 2.2 g/dL — ABNORMAL LOW (ref 3.5–5.0)
Alkaline Phosphatase: 205 U/L — ABNORMAL HIGH (ref 38–126)
Alkaline Phosphatase: 214 U/L — ABNORMAL HIGH (ref 38–126)
Anion gap: 18 — ABNORMAL HIGH (ref 5–15)
Anion gap: 22 — ABNORMAL HIGH (ref 5–15)
BUN: 37 mg/dL — ABNORMAL HIGH (ref 6–20)
BUN: 37 mg/dL — ABNORMAL HIGH (ref 6–20)
CO2: 16 mmol/L — ABNORMAL LOW (ref 22–32)
CO2: 13 mmol/L — ABNORMAL LOW (ref 22–32)
Calcium: 9.3 mg/dL (ref 8.9–10.3)
Calcium: 8.1 mg/dL — ABNORMAL LOW (ref 8.9–10.3)
Chloride: 102 mmol/L (ref 98–111)
Chloride: 96 mmol/L — ABNORMAL LOW (ref 98–111)
Creatinine, Ser: 3.11 mg/dL — ABNORMAL HIGH (ref 0.61–1.24)
Creatinine, Ser: 3.06 mg/dL — ABNORMAL HIGH (ref 0.61–1.24)
GFR, Estimated: 23 mL/min — ABNORMAL LOW (ref >60)
GFR, Estimated: 24 mL/min — ABNORMAL LOW (ref >60)
Glucose, Bld: 385 mg/dL — ABNORMAL HIGH (ref 70–99)
Glucose, Bld: 509 mg/dL (ref 70–99)
Potassium: 4.7 mmol/L (ref 3.5–5.1)
Potassium: 4 mmol/L (ref 3.5–5.1)
Sodium: 136 mmol/L (ref 135–145)
Sodium: 131 mmol/L — ABNORMAL LOW (ref 135–145)
Total Bilirubin: 3.6 mg/dL — ABNORMAL HIGH (ref <1.2)
Total Bilirubin: 4.5 mg/dL — ABNORMAL HIGH (ref <1.2)
Total Protein: 4.7 g/dL — ABNORMAL LOW (ref 6.5–8.1)
Total Protein: 4.6 g/dL — ABNORMAL LOW (ref 6.5–8.1)

## 2023-02-01 LAB — TROPONIN I (HIGH SENSITIVITY): Troponin I (High Sensitivity): 381 ng/L (ref <18)

## 2023-02-01 LAB — MAGNESIUM
Magnesium: 2.3 mg/dL (ref 1.7–2.4)
Magnesium: 2.6 mg/dL — ABNORMAL HIGH (ref 1.7–2.4)

## 2023-02-01 LAB — I-STAT VENOUS BLOOD GAS, ED
Acid-base deficit: 18 mmol/L — ABNORMAL HIGH (ref 0.0–2.0)
Bicarbonate: 14.6 mmol/L — ABNORMAL LOW (ref 20.0–28.0)
Calcium, Ion: 1.26 mmol/L (ref 1.15–1.40)
HCT: 40 % (ref 39.0–52.0)
Hemoglobin: 13.6 g/dL (ref 13.0–17.0)
O2 Saturation: 80 %
Potassium: 4.6 mmol/L (ref 3.5–5.1)
Sodium: 134 mmol/L — ABNORMAL LOW (ref 135–145)
TCO2: 17 mmol/L — ABNORMAL LOW (ref 22–32)
pCO2, Ven: 66 mm[Hg] — ABNORMAL HIGH (ref 44–60)
pH, Ven: 6.953 — CL (ref 7.25–7.43)
pO2, Ven: 71 mm[Hg] — ABNORMAL HIGH (ref 32–45)

## 2023-02-01 LAB — RAPID URINE DRUG SCREEN, HOSP PERFORMED
Amphetamines: NOT DETECTED
Barbiturates: NOT DETECTED
Benzodiazepines: NOT DETECTED
Cocaine: NOT DETECTED
Opiates: NOT DETECTED
Tetrahydrocannabinol: NOT DETECTED

## 2023-02-01 LAB — ETHANOL: Alcohol, Ethyl (B): <10 mg/dL (ref <10)

## 2023-02-01 LAB — I-STAT ARTERIAL BLOOD GAS, ED
Acid-base deficit: 16 mmol/L — ABNORMAL HIGH (ref 0.0–2.0)
Bicarbonate: 16.9 mmol/L — ABNORMAL LOW (ref 20.0–28.0)
Calcium, Ion: 1.26 mmol/L (ref 1.15–1.40)
HCT: 42 % (ref 39.0–52.0)
Hemoglobin: 14.3 g/dL (ref 13.0–17.0)
O2 Saturation: 94 %
Potassium: 4.7 mmol/L (ref 3.5–5.1)
Sodium: 132 mmol/L — ABNORMAL LOW (ref 135–145)
TCO2: 19 mmol/L — ABNORMAL LOW (ref 22–32)
pCO2 arterial: 71 mm[Hg] (ref 32–48)
pH, Arterial: 6.985 — CL (ref 7.35–7.45)
pO2, Arterial: 107 mm[Hg] (ref 83–108)

## 2023-02-01 LAB — CBC
HCT: 45.7 % (ref 39.0–52.0)
Hemoglobin: 13.8 g/dL (ref 13.0–17.0)
MCH: 28.9 pg (ref 26.0–34.0)
MCHC: 30.2 g/dL (ref 30.0–36.0)
MCV: 95.6 fL (ref 80.0–100.0)
Platelets: 57 10*3/uL — ABNORMAL LOW (ref 150–400)
RBC: 4.78 MIL/uL (ref 4.22–5.81)
RDW: 14.8 % (ref 11.5–15.5)
WBC: 13.6 10*3/uL — ABNORMAL HIGH (ref 4.0–10.5)
nRBC: 0.1 % (ref 0.0–0.2)

## 2023-02-01 LAB — ECHOCARDIOGRAM COMPLETE
Est EF: <20
Height: 65 in
S' Lateral: 2.8 cm
Weight: 3033.53 [oz_av]

## 2023-02-01 LAB — GLUCOSE, CAPILLARY
Glucose-Capillary: 492 mg/dL — ABNORMAL HIGH (ref 70–99)
Glucose-Capillary: 474 mg/dL — ABNORMAL HIGH (ref 70–99)
Glucose-Capillary: 458 mg/dL — ABNORMAL HIGH (ref 70–99)
Glucose-Capillary: 312 mg/dL — ABNORMAL HIGH (ref 70–99)
Glucose-Capillary: 266 mg/dL — ABNORMAL HIGH (ref 70–99)

## 2023-02-01 LAB — MRSA NEXT GEN BY PCR, NASAL: MRSA by PCR Next Gen: NOT DETECTED

## 2023-02-01 LAB — HEMOGLOBIN A1C
Hgb A1c MFr Bld: 5.4 % (ref 4.8–5.6)
Mean Plasma Glucose: 108.28 mg/dL

## 2023-02-01 LAB — TSH: TSH: 0.685 u[IU]/mL (ref 0.350–4.500)

## 2023-02-01 LAB — I-STAT CG4 LACTIC ACID, ED: Lactic Acid, Venous: 11.8 mmol/L (ref 0.5–1.9)

## 2023-02-01 LAB — PROTIME-INR
INR: 1.3 — ABNORMAL HIGH (ref 0.8–1.2)
Prothrombin Time: 16.7 s — ABNORMAL HIGH (ref 11.4–15.2)

## 2023-02-01 LAB — HIV ANTIBODY (ROUTINE TESTING W REFLEX): HIV Screen 4th Generation wRfx: NONREACTIVE

## 2023-02-01 LAB — T4, FREE: Free T4: 0.9 ng/dL (ref 0.61–1.12)

## 2023-02-01 LAB — LACTIC ACID, PLASMA
Lactic Acid, Venous: >9 mmol/L (ref 0.5–1.9)
Lactic Acid, Venous: 7.8 mmol/L (ref 0.5–1.9)

## 2023-02-01 MED ORDER — DEXTROSE 50 % IV SOLN: 0.0000 mL | INTRAVENOUS | Status: DC | PRN

## 2023-02-01 MED ORDER — NOREPINEPHRINE 16 MG/250ML-% IV SOLN
0.0000 ug/min | INTRAVENOUS | Status: DC
  Administered 2023-02-01: 60 ug/min via INTRAVENOUS
  Filled 2023-02-01: qty 250

## 2023-02-01 MED ORDER — CHLORHEXIDINE GLUCONATE CLOTH 2 % EX PADS: 6.0000 | MEDICATED_PAD | Freq: Every day | CUTANEOUS | Status: DC

## 2023-02-01 MED ORDER — SODIUM CHLORIDE 0.9% FLUSH
10.0000 mL | Freq: Two times a day (BID) | INTRAVENOUS | Status: DC
Start: 2023-02-01 — End: 2023-02-01
  Administered 2023-02-01: 10 mL

## 2023-02-01 MED ORDER — PERFLUTREN LIPID MICROSPHERE
1.0000 mL | INTRAVENOUS | Status: AC | PRN
  Administered 2023-02-01: 8 mL via INTRAVENOUS

## 2023-02-01 MED ORDER — ORAL CARE MOUTH RINSE: 15.0000 mL | OROMUCOSAL | Status: DC | PRN

## 2023-02-01 MED ORDER — ORAL CARE MOUTH RINSE: 15.0000 mL | OROMUCOSAL | Status: DC

## 2023-02-01 MED ORDER — INSULIN ASPART 100 UNIT/ML IJ SOLN
5.0000 [IU] | Freq: Once | INTRAMUSCULAR | Status: AC
  Administered 2023-02-01: 5 [IU] via SUBCUTANEOUS

## 2023-02-01 MED ORDER — HEPARIN SODIUM (PORCINE) 5000 UNIT/ML IJ SOLN
5000.0000 [IU] | Freq: Two times a day (BID) | INTRAMUSCULAR | Status: DC
  Administered 2023-02-01: 5000 [IU] via SUBCUTANEOUS
  Filled 2023-02-01: qty 1

## 2023-02-01 MED ORDER — AMIODARONE HCL IN DEXTROSE 360-4.14 MG/200ML-% IV SOLN
30.0000 mg/h | INTRAVENOUS | Status: DC
  Filled 2023-02-01: qty 200

## 2023-02-01 MED ORDER — INSULIN REGULAR(HUMAN) IN NACL 100-0.9 UT/100ML-% IV SOLN: INTRAVENOUS | Status: DC

## 2023-02-01 MED ORDER — POLYETHYLENE GLYCOL 3350 17 G PO PACK: 17.0000 g | PACK | Freq: Every day | ORAL | Status: DC | PRN

## 2023-02-01 MED ORDER — PANTOPRAZOLE SODIUM 40 MG IV SOLR: 40.0000 mg | Freq: Every day | INTRAVENOUS | Status: DC

## 2023-02-01 MED ORDER — MAGNESIUM SULFATE 2 GM/50ML IV SOLN
2.0000 g | Freq: Once | INTRAVENOUS | Status: AC
  Administered 2023-02-01: 2 g via INTRAVENOUS
  Filled 2023-02-01: qty 50

## 2023-02-01 MED ORDER — DOCUSATE SODIUM 50 MG/5ML PO LIQD: 100.0000 mg | Freq: Two times a day (BID) | ORAL | Status: DC | PRN

## 2023-02-01 MED ORDER — FAMOTIDINE 20 MG PO TABS: 20.0000 mg | ORAL_TABLET | Freq: Two times a day (BID) | ORAL | Status: DC

## 2023-02-01 MED ORDER — INSULIN REGULAR(HUMAN) IN NACL 100-0.9 UT/100ML-% IV SOLN
INTRAVENOUS | Status: DC
  Administered 2023-02-01: 4.8 [IU]/h via INTRAVENOUS
  Filled 2023-02-01: qty 100

## 2023-02-01 MED ORDER — AMIODARONE LOAD VIA INFUSION
150.0000 mg | Freq: Once | INTRAVENOUS | Status: AC
  Administered 2023-02-01: 150 mg via INTRAVENOUS
  Filled 2023-02-01: qty 83.34

## 2023-02-01 MED ORDER — AMIODARONE HCL IN DEXTROSE 360-4.14 MG/200ML-% IV SOLN
60.0000 mg/h | INTRAVENOUS | Status: AC
  Administered 2023-02-01 (×2): 60 mg/h via INTRAVENOUS

## 2023-02-01 MED ORDER — PHENYLEPHRINE CONCENTRATED 100MG/250ML (0.4 MG/ML) INFUSION SIMPLE
0.0000 ug/min | INTRAVENOUS | Status: DC
  Administered 2023-02-01: 400 ug/min via INTRAVENOUS
  Filled 2023-02-01 (×3): qty 250

## 2023-02-01 MED ORDER — SODIUM BICARBONATE 8.4 % IV SOLN
100.0000 meq | Freq: Once | INTRAVENOUS | Status: AC
  Administered 2023-02-01: 100 meq via INTRAVENOUS
  Filled 2023-02-01: qty 50

## 2023-02-01 MED ORDER — COLCHICINE 0.6 MG PO TABS
0.6000 mg | ORAL_TABLET | Freq: Every day | ORAL | Status: DC
  Administered 2023-02-01: 0.6 mg
  Filled 2023-02-01: qty 1

## 2023-02-01 MED ORDER — SODIUM CHLORIDE 0.9 % IV SOLN
2.0000 g | Freq: Every day | INTRAVENOUS | Status: DC
  Administered 2023-02-01: 2 g via INTRAVENOUS
  Filled 2023-02-01: qty 20

## 2023-02-01 MED ORDER — SODIUM CHLORIDE 0.9% FLUSH: 10.0000 mL | INTRAVENOUS | Status: DC | PRN

## 2023-02-01 MED ORDER — INSULIN ASPART 100 UNIT/ML IJ SOLN
0.0000 [IU] | INTRAMUSCULAR | Status: DC
  Administered 2023-02-01: 6 [IU] via SUBCUTANEOUS

## 2023-02-01 MED ORDER — PHENYLEPHRINE HCL-NACL 20-0.9 MG/250ML-% IV SOLN
0.0000 ug/min | INTRAVENOUS | Status: DC
  Administered 2023-02-01: 180 ug/min via INTRAVENOUS
  Administered 2023-02-01: 400 ug/min via INTRAVENOUS
  Administered 2023-02-01: 140 ug/min via INTRAVENOUS
  Administered 2023-02-01: 20 ug/min via INTRAVENOUS
  Filled 2023-02-01 (×5): qty 250

## 2023-02-01 MED ORDER — EPINEPHRINE (ANAPHYLAXIS) 30 MG/30ML IJ SOLN
0.5000 ug/min | INTRAMUSCULAR | Status: DC
  Administered 2023-02-01: 20 ug/min via INTRAVENOUS
  Filled 2023-02-01 (×2): qty 10

## 2023-02-02 LAB — GLUCOSE, CAPILLARY: Glucose-Capillary: 406 mg/dL — ABNORMAL HIGH (ref 70–99)

## 2023-02-02 NOTE — Procedures (Signed)
Patient Name: VIVIEN STEPHAN  MRN: [id number]  Epilepsy Attending: Charlsie Quest  Referring Physician/Provider: Lidia Collum, PA-C  Date: 01/20/2023 Duration: 23.12 mins  Patient history: 52 yo M with a history of Myotonic Muscular Dystrophy admitted to the ICU following out of hospital cardiac arrest. EEG to evaluate for seizure  Level of alertness: comatose  AEDs during EEG study: None  Technical aspects: This EEG study was done with scalp electrodes positioned according to the 10-20 International system of electrode placement. Electrical activity was reviewed with band pass filter of 1-70Hz , sensitivity of 7 uV/mm, display speed of 20mm/sec with a 60Hz  notched filter applied as appropriate. EEG data were recorded continuously and digitally stored.  Video monitoring was available and reviewed as appropriate.  Description: EEG showed continuous generalized background suppression, not reactive to stimulation. Asystole was noted on EKG leads. Hyperventilation and photic stimulation were not performed.     ABNORMALITY - Background suppression, generalized  IMPRESSION: This study is showed no cerebral activity. This finding along with asystole is most likely due to brain death. No seizures or epileptiform discharges were seen throughout the recording.  Yeray Tomas Annabelle Harman

## 2023-02-02 NOTE — Progress Notes (Addendum)
NAME:  Peter Steele, MRN:  [id number], DOB:  1971/01/24, LOS: 0 ADMISSION DATE:  01/31/2023, CONSULTATION DATE:  11/30 REFERRING MD:  Dr. Adela Lank, CHIEF COMPLAINT:  cardiac arrest   History of Present Illness:  Patient is a 52 yo M w/ pertinent PMH myotonic muscular dystrophy presents to [facility] ED on 11/29 w/ cardiac arrest.  On 11/29 patient was eating dinner then slumped over and became unresponsive.  Patient received about 5 minutes of bystander CPR until EMS arrived.  Patient asystole and ROSC after 20 minutes.  Patient was intubated in field.  Patient lost pulses again and an additional 4 minutes of CPR given until ROSC obtained.  Transferred to [facility] ED.  On arrival patient noted to be pulseless and CPR restarted.  ROSC achieved after 15 minutes.  EKG showing wide-complex irregular tachycardia.  CXR showing ETT right mainstem and ETT retracted 2 cm.  Levo started for hypotension.  Patient had 2 more episodes of cardiac arrest and received several rounds of CPR and ROSC obtained.  ED physician had conversation with sister who is POA and states no more further CPR but is okay with medication for now and understanding that patient has poor neurologic prognosis given prolonged CPR time.  Added vaso and epi for hypotension.  VBG 6.81, 75, 47, 12.  Bicarb drip added. PCCM consulted for ICU admission.  Pertinent ED labs: WBC 28, platelets 66, troponin 241, LA 13.3 then 11.8  Pertinent  Medical History   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HCC)    Followed by [facility] for this     Significant Hospital Events: Including procedures, antibiotic start and stop dates in addition to other pertinent events   11/29 cardiac arrest  11/30 4 pressors, Fib RVR, refractory acidosis. Multisystem organ failure. DNR.   Interim History / Subjective:   On 4 pressors  Afib RVR with rates 160s   CXR reviewed -- ETT chest tube ok. OGT malpositioned.  EKG reviewed --  ST elevation suggestive of acute pericarditis   Objective   Blood pressure (!) 89/68, pulse (!) 112, temperature 99.1 F (37.3 C), resp. rate (!) 32, height 5\' 5"  (1.651 m), weight 86 kg, SpO2 94%.    Vent Mode: PRVC FiO2 (%):  [80 %-100 %] 100 % Set Rate:  [12 bmp-32 bmp] 32 bmp Vt Set:  [450 mL-490 mL] 450 mL PEEP:  [5 cmH20-8 cmH20] 8 cmH20 Plateau Pressure:  [27 cmH20] 27 cmH20   Intake/Output Summary (Last 24 hours) at 01/07/2023 0955 Last data filed at 01/18/2023 0801 Gross per 24 hour  Intake 4763.76 ml  Output 50 ml  Net 4713.76 ml   Filed Weights   01/09/2023 0400  Weight: 86 kg    Examination: General:  Critically and chronically ill appearing M  Neuro: Pupils are 5mm fixed. No response to pain. No cough gag.  HEENT: ETT secure. Anicteric sclera. There are thick crusted secretions within proximal part of ETT but no significant oral secretions or other secretions within ETT. Crepitus overlying neck.    CV: irir cap refill is sluggish.  PULM:  L chest tube without air leak. Mechanically ventilated. Minimal resp secretions. There is a squeaking sound overlying L pericardium but is associated w respiratory cycle (could be chest tube)   GI: soft thin hypoactive  Extremities: no acute appearing joint deformity. RLE IO.  Skin: Mottling on lower extremities. Otherwise pale and very cool to touch.     [facility]  Problem list     Assessment & Plan:   GOC DNR Status  -DNR in event of arrest. I am concerned pt is in the dying process. Family is in the process of gathering and is contemplating donor workup vs comfort care-- I am concerned he may not survive workup and will await word from HB regarding their plans  -I spoke w family re ICU procedures -- CVC, art line. Ideally they are wanting to speak w HB re candidacy first, but if needed ok for PICC in interim. I have placed order for this.   OOHCA, prolonged downtime Shock  -suspected Sudden cardiac death 2/2  myotonic MD P -on max NE, Epi, neo, and vaso with poor hemodynamics.  -PICC to be placed, right now pressors via IO  -defer CT H, too unstable.  -Defer eeg for now, can revisit later today if still alive   -ECHO is ordered   Acute encephalopathy  Concern for anoxic encephalopathy -clinically, concern for brain death. With persistent acidosis, marked metabolic abnormalities , do not think we can eval for this even when he meets timeline threshold to consider brain death testing.  P -as above CT EEG deferred for now   -is on no sedation -we are continuing to try to correct metabolic abnormalities as able and provide supportive hemodynamic care.   Acute respiratory failure with hypoxemia and hypercarbia Tension ptx on L  Possible aspiration  P -Chest tube to suction -spent a while changing vent setting this morning -- we are now on 100% PEEP 8 rate 32  -depending on course this morning, may need art line see GOC discussed above  -cont MV support  -rocephin   Afib RVR -had rcvd amio bolus but not on gtt -rates in the 160s P -amio bolus then gtt.  -optimize lytes   Possible acute pericarditis -L chest sounds are more c/w chest tube rub, but consider pericardial rub  P -repeat EKG -will go ahead and order for colchicine and PPI  -after PICC placed will get CRP ESR -ECHO is ordered   AKI Severe metabolic acidosis w elevated AG  Hyperkalemia, improved Hypermagnesemia  Hyponatremia Hypochloridemia  P -remains on Bicarb gtt-- I have extended the duration of this  -foley, strict IO -trend renal indices  Shock liver Hyperbilirubinemia  P -follow LFTs   Lactic acidosis -related to above  -follow PRN   Hyperglycemia -much of this I think is r/t to his pressor requirement  P -insulin gtt   Leukocytosis -reactive vs infectious P -on rocephin for aspiration   Thrombocytopenia, presumed acute  -not sure what initial catalyst for plt drop is. Prior labs are 2021,  plt WNL  P -trend cbc -check coags  Malpositioned OGT P -will have RN reposition, KUB to follow   Hx of myotonic muscular dystrophy -no acute intervention   Best Practice (right click and "Reselect all SmartList Selections" daily)   Diet/type: NPO DVT prophylaxis: heparin injection 5,000 Units Start: 01/13/2023 1000 SCDs Start: 01/10/2023 0049   Pressure ulcer(s): not present on admission  GI prophylaxis: PPI Lines: N/A -- PICC pending Foley:  Yes, and it is still needed Code Status:  DNR Last date of multidisciplinary goals of care discussion [11/30 spoke with sister and other family members at bedside. They made decision to be not perform any more compressions/shock if patient loses pulses again. They are okay keeping patient intubated and doing pressors for now.]  Labs   CBC: Recent Labs  Lab 01/31/23 2234 01/31/23  2239 01/18/2023 0050 01/08/2023 0209 01/11/2023 0459  WBC 28.8*  --   --   --  13.6*  NEUTROABS 22.5*  --   --   --   --   HGB 13.3 14.6  15.3 13.6 14.3 13.8  HCT 44.6 43.0  45.0 40.0 42.0 45.7  MCV 98.7  --   --   --  95.6  PLT 66*  --   --   --  57*    Basic Metabolic Panel: Recent Labs  Lab 01/31/23 2239 01/22/2023 0040 01/16/2023 0050 01/24/2023 0209 01/08/2023 0459  NA 131*  131* 136 134* 132* 131*  K 7.4*  7.5* 4.7 4.6 4.7 4.0  CL 105 102  --   --  96*  CO2  --  16*  --   --  13*  GLUCOSE 193* 385*  --   --  509*  BUN 63* 37*  --   --  37*  CREATININE 2.80* 3.11*  --   --  3.06*  CALCIUM  --  9.3  --   --  8.1*  MG  --  2.3  --   --  2.6*   GFR: Estimated Creatinine Clearance: 28.2 mL/min (A) (by C-G formula based on SCr of 3.06 mg/dL (H)). Recent Labs  Lab 01/31/23 2234 01/31/23 2239 01/05/2023 0050 01/29/2023 0459 01/30/2023 0717  WBC 28.8*  --   --  13.6*  --   LATICACIDVEN  --  13.3* 11.8* >9.0* 7.8*    Liver Function Tests: Recent Labs  Lab 01/13/2023 0040 01/11/2023 0459  AST 84* 121*  ALT 39 45*  ALKPHOS 205* 214*  BILITOT 3.6* 4.5*   PROT 4.7* 4.6*  ALBUMIN 2.1* 2.2*   No results for input(s): "LIPASE", "AMYLASE" in the last 168 hours. No results for input(s): "AMMONIA" in the last 168 hours.  ABG    Component Value Date/Time   PHART 6.985 (LL) 01/17/2023 0209   PCO2ART 71.0 (HH) 01/12/2023 0209   PO2ART 107 01/06/2023 0209   HCO3 16.9 (L) 01/04/2023 0209   TCO2 19 (L) 01/03/2023 0209   ACIDBASEDEF 16.0 (H) 01/21/2023 0209   O2SAT 94 01/17/2023 0209     Coagulation Profile: No results for input(s): "INR", "PROTIME" in the last 168 hours.  Cardiac Enzymes: No results for input(s): "CKTOTAL", "CKMB", "CKMBINDEX", "TROPONINI" in the last 168 hours.  HbA1C: Hemoglobin A1C  Date/Time Value Ref Range Status  04/16/2018 03:55 PM 5.3 4.0 - 5.6 % Final   Hgb A1c MFr Bld  Date/Time Value Ref Range Status  01/15/2023 04:59 AM 5.4 4.8 - 5.6 % Final    Comment:    (NOTE) Pre diabetes:          5.7%-6.4%  Diabetes:              >6.4%  Glycemic control for   <7.0% adults with diabetes     CBG: Recent Labs  Lab 01/31/23 2233 01/09/2023 0710 01/19/2023 0843  GLUCAP 175* 492* 474*     CRITICAL CARE Performed by: Lanier Clam   Total critical care time: 110 minutes  Critical care time was exclusive of separately billable procedures and treating other patients. Critical care was necessary to treat or prevent imminent or life-threatening deterioration.  Critical care was time spent personally by me on the following activities: development of treatment plan with patient and/or surrogate as well as nursing, discussions with consultants, evaluation of patient's response to treatment, examination of patient, obtaining history from patient or  surrogate, ordering and performing treatments and interventions, ordering and review of laboratory studies, ordering and review of radiographic studies, pulse oximetry and re-evaluation of patient's condition.  Tessie Fass MSN, AGACNP-BC [facility] Pulmonary/Critical  Care Medicine Amion for pager  01/15/2023, 9:55 AM

## 2023-02-02 NOTE — ED Provider Notes (Signed)
.  Cardioversion  Date/Time: 01/15/2023 3:03 PM  Performed by: Melene Plan, DO Authorized by: Melene Plan, DO   Consent:    Consent obtained:  Emergent situation Pre-procedure details:    Cardioversion basis:  Emergent   Rhythm:  Atrial fibrillation   Electrode placement:  Anterior-posterior Patient sedated: No Attempt one:    Cardioversion mode:  Synchronous   Waveform:  Biphasic   Shock (Joules):  120   Shock outcome:  Conversion to other rhythm (sinus tachycardia) Post-procedure details:    Patient status:  Unresponsive   Patient tolerance of procedure:  Tolerated well, no immediate complications     Melene Plan, DO 01/30/2023 1503

## 2023-02-02 NOTE — Progress Notes (Signed)
Critically ill 52 yo w myotonic muscular dystrophy admitted 11/29 after cardiac arrest At start of shift today, fourth pressor added and has remained on max doses all day, as well as a bicarb gtt, amio gtt, high vent settings.  His ECHO was extremely limited 2/2 degree of subcutaneous emphysema, L chest tube etc, but was significant for LVEF < 20%. No pericardial effusion.   Despite the measures above & those further detailed in his progress note, pt continued to decline.  At several times today I relayed my concern to family that he is very unlikely to survive the day. Earlier this morning, I spoke with an HB rep and relayed my concerns for the same.   This afternoon he had further hemodynamic collapse. I arrived to unit to speak with HB at their request (presumably to discuss workup needs). On my arrival, pts hemodynamics were further collapsing and he was very clearly in the active dying process. I tried to relay this to HB, and nearly immediately after the patient did in fact die.   TOD 1325.    Tessie Fass MSN, AGACNP-BC [facility] Pulmonary/Critical Care Medicine 01/14/2023, 2:09 PM

## 2023-02-02 NOTE — Progress Notes (Signed)
   01/22/2023 1356  Spiritual Encounters  Type of Visit Initial  Care provided to: Family  Referral source Nurse (RN/NT/LPN)  Reason for visit Patient death  OnCall Visit Yes  Spiritual Framework  Presenting Themes Values and beliefs;Impactful experiences and emotions  Community/Connection Family  Family Stress Factors Loss;Health changes  Interventions  Spiritual Care Interventions Made Established relationship of care and support;Compassionate presence;Reflective listening;Prayer  Intervention Outcomes  Outcomes Connection to spiritual care;Awareness of support  Spiritual Care Plan  Spiritual Care Issues Still Outstanding No further spiritual care needs at this time (see row info)   Family requested that a chaplain come to pray for the patient who had died. Patient's stepmother requested chaplain to pray for the patient's "transition" to heaven. Chaplain prayed as requested.   Arlyce Dice, Chaplain Resident [phone]

## 2023-02-02 NOTE — Progress Notes (Signed)
Peripherally Inserted Central Catheter Placement  The IV Nurse has discussed with the patient and/or persons authorized to consent for the patient, the purpose of this procedure and the potential benefits and risks involved with this procedure.  The benefits include less needle sticks, lab draws from the catheter, and the patient may be discharged home with the catheter. Risks include, but not limited to, infection, bleeding, blood clot (thrombus formation), and puncture of an artery; nerve damage and irregular heartbeat and possibility to perform a PICC exchange if needed/ordered by physician.  Alternatives to this procedure were also discussed.  Bard Power PICC patient education guide, fact sheet on infection prevention and patient information card has been provided to patient /or left at bedside.  Telephone consent obtained from sister, Wynona Canes.  PICC Placement Documentation  PICC Triple Lumen 01/27/2023 Right Brachial 0 cm (Active)  Indication for Insertion or Continuance of Line Vasoactive infusions 01/19/2023 1101  Exposed Catheter (cm) 0 cm 01/22/2023 1101  Site Assessment Clean, Dry, Intact 01/16/2023 1101  Lumen #1 Status Saline locked;Blood return noted 01/27/2023 1101  Lumen #2 Status Saline locked;Blood return noted 01/19/2023 1101  Lumen #3 Status Saline locked;Blood return noted 01/21/2023 1101  Dressing Type Transparent;Securing device 01/17/2023 1101  Dressing Status Antimicrobial disc in place;Clean, Dry, Intact 01/16/2023 1101  Line Care Connections checked and tightened 01/14/2023 1101  Line Adjustment (NICU/IV Team Only) No 01/06/2023 1101  Dressing Intervention New dressing;Adhesive placed at insertion site (IV team only) 01/27/2023 1101  Dressing Change Due 02/08/23 01/21/2023 1101       Burnard Bunting Chenice 01/19/2023, 11:02 AM

## 2023-02-02 NOTE — Progress Notes (Signed)
Tech attempted, however nurse requested tech hold off on EEG until family arrives to make decisions on care.

## 2023-02-02 NOTE — ED Notes (Signed)
Per Dr. Lonzo Candy; CT can wait until morning. 43M RN made aware.

## 2023-02-02 NOTE — Progress Notes (Signed)
eLink Physician-Brief Progress Note Patient Name: Peter Steele DOB: 1970-10-01 MRN: [id number]   Date of Service  01/22/2023  HPI/Events of Note  Patient with a history of Myotonic Muscular Dystrophy admitted to the ICU following out of hospital cardiac arrest s/p ROSC, he is currently on multiple pressors, intubated and mechanically ventilated. 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  eICU Interventions  New Patient Evaluation.        Brayli Klingbeil U Kenai Fluegel 01/06/2023, 3:06 AM

## 2023-02-02 NOTE — Progress Notes (Signed)
Pt HR suddenly went into 40s then quickly decreased to 0.  PT expired at 1325 surrounded by sister and multiple loved ones in room.  Pt was limited code already intubated and maxed out on numerous vasopressors.  Informed Tessie Fass NP and [facility]  This RN and Bri Butner auscultated for heart sounds for 1 minute with no sounds found. Erick Blinks, RN

## 2023-02-02 NOTE — Progress Notes (Addendum)
Family (Christine, sister and patient's parents) informed this RN that they wish to make the patient an organ donor. Referral called at 0315, awaiting response from [facility] to determine candidacy. [facility] RN and CCM Pia Mau, PA-C notified.  0600 Received call from [employer] with [facility]. Per Nadara Mode, the patient is a candidate for organ donation. Sister Wynona Canes has given verbal consent for organ donation; completion of paperwork with [facility] is required before proceeding with workup. Alyson states that [facility] will follow up with Wynona Canes to complete paperwork and will then contact nursing staff.

## 2023-02-02 NOTE — Death Summary Note (Signed)
DEATH SUMMARY   Patient Details  Name: Peter Steele MRN: [id number] DOB: 03/24/1970  Admission/Discharge Information   Admit Date:  2023/02/16  Date of Death: Date of Death: 17-Feb-2023  Time of Death: Time of Death: 1325  Length of Stay: 0  Referring Physician: Darral Dash, DO   Reason(s) for Hospitalization  Cardiac Arrest   Diagnoses  Preliminary cause of death: Acute heart failure with reduced ejection fraction  Secondary Diagnoses (including complications and co-morbidities):  Principal Problem:   Cardiac arrest [facility]) Active Problems:   Shock (HCC)   Tension pneumothorax   AKI (acute kidney injury) (HCC)   DNR (do not resuscitate)   Goals of care, counseling/discussion   Acute respiratory failure with hypoxia and hypercapnia (HCC)   Atrial fibrillation with RVR (HCC)   Shock liver   Metabolic acidosis Acute HFrEF Myotonic muscular dystrophy  Lactic acidosis  Metabolic acidosis Elevated Anion Gap Hyponatremia Hypochloridemia Hypocalcemia Hyperkalemia Hypermagnesemia Hypocalcemia Malnutrition  Coagulopathy  Hyperbilirubinemia  Thrombocytopenia Acute encephalopathy  Aspiration PNA  Pericarditis  Hyperglycemia  Leukocytosis  Malpositioned OGT Pneumomediastinum  Subcutaneous emphysema    Brief Hospital Course (including significant findings, care, treatment, and services provided and events leading to death)  Peter Steele is a 52 y.o. year old male who has a history of myotonic muscular dystrophy, who was admitted 02/16/2023 following a witnessed, sudden OOH cardiac arrest.  In total he received over 45 minutes of CPR, with brief periods of ROSC. He was intubated in the field. In the ED he was grossly unstable, requiring pressors via IO as well as cardioversion for Afib RVR. He was found to have a tension pneumothorax for which a L chest tube was placed.  He arrested 2 additional times in the ED and required escalating pressors for his  worsening shock.   He was admitted to critical care service early 2023-02-17 (night shift). Metabolic abnormalities including severe metabolic acidosis, AKI, shock liver, electrolyte abnormalities, hypoxic& hypercarbic resp failure, worsening shock were treated. He was also placed on abx for possible aspiration. He was too unstable to undergo CT head, and was taken directly to ICU from ED. Family did decide that in the event of another arrest, he is not to be resuscitated & he was accordingly made DNR.  02-17-2023 day shift, he continued to decline. He was on max doses of 4 pressors since about 0800. He had refractory acidosis despite bicarb gtt and pushes, vent changes, and supportive care otherwise. He had recurrent Afib RVR and was started on an amio gtt after bolus. He was started on colchicine for possible acute pericarditis. The family was very hopeful for organ donation despite his very grim prognosis and ongoing decline. A PICC was placed. His ECHO resulted with acute HFrEF EF< 20%. Subcutaneous emphysema, pneumomediastinum and chest tube limited windows otherwise. While awaiting donor evaluation, he continued to decline despite aggressive ICU care. At 1325 on Feb 17, 2023 he died with family at his bedside.    Pertinent Labs and Studies  Significant Diagnostic Studies ECHOCARDIOGRAM COMPLETE  Result Date: 02-17-2023    ECHOCARDIOGRAM REPORT   Patient Name:   Peter Steele Date of Exam: 2023/02/17 Medical Rec #:  [id number]            Height:       65.0 in Accession #:    [id number]           Weight:       189.6 lb Date of Birth:  02/21/1971  BSA:          1.934 m Patient Age:    52 years             BP:           70/42 mmHg Patient Gender: M                    HR:           108 bpm. Exam Location:  Inpatient Procedure: 2D Echo, Color Doppler, Cardiac Doppler and Intracardiac            Opacification Agent Indications:    Cardiac Arrest i46.9  History:        Patient has prior history of  Echocardiogram examinations, most                 recent 07/08/2019.  Sonographer:    Irving Burton Senior RDCS Referring Phys: [id number] Lidia Collum  Sonographer Comments: Extremely poor echo windows, study is non-diagnostic. Post CPR, patient has extensive subcutaneous emphysema, pneumomediastium, and left chest tube for pneumothorax. IMPRESSIONS  1. Poor quality study due to difficult windows for TTE. Globally LVEF appears severely reduced, <20%. RV not well visualized.  2. Left ventricular ejection fraction, by estimation, is <20%. The left ventricle has severely decreased function. Left ventricular endocardial border not optimally defined to evaluate regional wall motion. The left ventricular internal cavity size was not well visualized. Left ventricular diastolic function could not be evaluated.  3. Right ventricular systolic function was not well visualized. The right ventricular size is not well visualized. Tricuspid regurgitation signal is inadequate for assessing PA pressure.  4. The mitral valve was not well visualized. No evidence of mitral valve regurgitation.  5. The aortic valve is tricuspid. Aortic valve regurgitation is not visualized. No aortic stenosis is present. FINDINGS  Left Ventricle: Left ventricular ejection fraction, by estimation, is <20%. The left ventricle has severely decreased function. Left ventricular endocardial border not optimally defined to evaluate regional wall motion. Definity contrast agent was given  IV to delineate the left ventricular endocardial borders. The left ventricular internal cavity size was not well visualized. Suboptimal image quality limits for assessment of left ventricular hypertrophy. Left ventricular diastolic function could not be  evaluated. Right Ventricle: The right ventricular size is not well visualized. Right vetricular wall thickness was not well visualized. Right ventricular systolic function was not well visualized. Tricuspid regurgitation signal is  inadequate for assessing PA pressure. Left Atrium: Left atrial size was not well visualized. Right Atrium: Right atrial size was not well visualized. Pericardium: There is no evidence of pericardial effusion. Mitral Valve: The mitral valve was not well visualized. No evidence of mitral valve regurgitation. Tricuspid Valve: The tricuspid valve is not well visualized. Tricuspid valve regurgitation is not demonstrated. No evidence of tricuspid stenosis. Aortic Valve: The aortic valve is tricuspid. Aortic valve regurgitation is not visualized. No aortic stenosis is present. Pulmonic Valve: The pulmonic valve was not well visualized. Pulmonic valve regurgitation is trivial. No evidence of pulmonic stenosis. Aorta: The aortic root and ascending aorta are structurally normal, with no evidence of dilitation. Venous: IVC assessment for right atrial pressure unable to be performed due to mechanical ventilation. IAS/Shunts: The interatrial septum was not well visualized.  LEFT VENTRICLE PLAX 2D LVIDd:         3.80 cm LVIDs:         2.80 cm LV PW:         0.90 cm  LV IVS:        0.70 cm LVOT diam:     1.90 cm LVOT Area:     2.84 cm   AORTA Ao Root diam: 3.10 cm Ao Asc diam:  2.85 cm  SHUNTS Systemic Diam: 1.90 cm Lennie Odor MD Electronically signed by Lennie Odor MD Signature Date/Time: 01/22/2023/1:02:24 PM    Final    DG Abd 1 View  Result Date: 01/13/2023 CLINICAL DATA:  Gastrointestinal 2 EXAM: ABDOMEN - 1 VIEW COMPARISON:  03/29/2021 FINDINGS: Enteric tube tip and side-port overlaps the stomach. A chest tube is present on the left, reference dedicated chest radiograph. The stomach is no longer gas distended. There is a mottled lucency over the lower chest and upper abdomen attributed to soft tissue emphysema. No gas dilated bowel. Cholecystectomy clips. IMPRESSION: Enteric tube with tip and side port at the stomach. The stomach has been decompressed since prior chest radiograph. Electronically Signed   By:  Tiburcio Pea M.D.   On: 01/26/2023 10:23   [place] EKG SITE RITE  Result Date: 01/11/2023 If Site Rite image not attached, placement could not be confirmed due to current cardiac rhythm.  DG Chest Port 1 View  Result Date: 01/07/2023 CLINICAL DATA:  Chest tube placement EXAM: PORTABLE CHEST 1 VIEW COMPARISON:  01/28/2023 FINDINGS: Interval placement of a left chest tube with evacuation of the left pneumothorax. No significant residual is identified. Decreased inspiratory effort since the previous study. Heart size is normal for technique. Persistent finding of extensive subcutaneous emphysema and pneumomediastinum in the upper chest and base of neck. Appliances are unchanged in position with endotracheal tube measuring about 1.5 cm above the carina and enteric tube projecting over the mid esophagus. IMPRESSION: 1. Interval placement of left chest tube with evacuation of left pneumothorax. Extensive subcutaneous emphysema remains. 2. Appliances are unchanged in position with enteric tube tip again projecting over the mid esophagus. Electronically Signed   By: Burman Nieves M.D.   On: 01/10/2023 02:07   DG Chest Port 1 View  Result Date: 01/12/2023 CLINICAL DATA:  Intubation and endotracheal tube EXAM: PORTABLE CHEST 1 VIEW COMPARISON:  01/31/2023 FINDINGS: An endotracheal tube has been placed. Visualization of the tip is limited due to an overlying electronic device but the tip appears to be about 1.5 cm above the carina. The enteric tube has been placed with tip projecting over the middle of the heart, likely in the mid/distal esophagus. Advancement is recommended. Since the prior study, there is interval development of extensive subcutaneous emphysema. Gas in the upper mediastinum and neck. Moderate left pneumothorax. Surgical clips in the right upper quadrant. IMPRESSION: 1. Appliances positioned as discussed with enteric tube tip projecting over the mid esophagus. 2. Interval development of  extensive subcutaneous emphysema, pneumomediastinum in the upper chest and base of neck, and left pneumothorax. The clinical service is aware and a subsequent chest radiograph demonstrates placement of a left chest tube. Electronically Signed   By: Burman Nieves M.D.   On: 01/08/2023 02:05   DG Chest Portable 1 View  Result Date: 01/31/2023 CLINICAL DATA:  post cpr EXAM: PORTABLE CHEST 1 VIEW COMPARISON:  Chest x-ray 03/01/2009 FINDINGS: Patient is rotated. Possible right mainstem bronchi intubation. Enteric tube courses below the hemidiaphragm with tip and side port overlying the expected region the gastric lumen. The heart and mediastinal contours are grossly unremarkable in the setting of low lung volumes. Low lung volumes. Bibasilar atelectasis. No focal consolidation. No pulmonary edema. No pleural effusion. No  pneumothorax. No acute osseous abnormality. Gaseous distension of the gastric lumen. Right upper quadrant surgical clips. IMPRESSION: 1. Poss right mainstem bronchi intubation in the setting low lung volumes and patient rotation. Recommend retracting by 1cm and repeating radiograph with improved inspiratory effort. 2. Enteric tube in good position. 3. Gaseous distension of the gastric lumen. Electronically Signed   By: Tish Frederickson M.D.   On: 01/31/2023 23:02    Microbiology Recent Results (from the past 240 hour(s))  MRSA Next Gen by PCR, Nasal     Status: None   Collection Time: 01/17/2023  3:22 AM   Specimen: Nasal Mucosa; Nasal Swab  Result Value Ref Range Status   MRSA by PCR Next Gen NOT DETECTED NOT DETECTED Final    Comment: (NOTE) The GeneXpert MRSA Assay (FDA approved for NASAL specimens only), is one component of a comprehensive MRSA colonization surveillance program. It is not intended to diagnose MRSA infection nor to guide or monitor treatment for MRSA infections. Test performance is not FDA approved in patients less than 21 years old. Performed at [facility], [address], [place], [place] [address]     Lab Basic Metabolic Panel: Recent Labs  Lab 01/31/23 2239 01/25/2023 0040 01/17/2023 0050 01/17/2023 0209 01/15/2023 0459  NA 131*  131* 136 134* 132* 131*  K 7.4*  7.5* 4.7 4.6 4.7 4.0  CL 105 102  --   --  96*  CO2  --  16*  --   --  13*  GLUCOSE 193* 385*  --   --  509*  BUN 63* 37*  --   --  37*  CREATININE 2.80* 3.11*  --   --  3.06*  CALCIUM  --  9.3  --   --  8.1*  MG  --  2.3  --   --  2.6*   Liver Function Tests: Recent Labs  Lab 01/25/2023 0040 01/06/2023 0459  AST 84* 121*  ALT 39 45*  ALKPHOS 205* 214*  BILITOT 3.6* 4.5*  PROT 4.7* 4.6*  ALBUMIN 2.1* 2.2*   No results for input(s): "LIPASE", "AMYLASE" in the last 168 hours. No results for input(s): "AMMONIA" in the last 168 hours. CBC: Recent Labs  Lab 01/31/23 2234 01/31/23 2239 01/19/2023 0050 01/27/2023 0209 01/30/2023 0459  WBC 28.8*  --   --   --  13.6*  NEUTROABS 22.5*  --   --   --   --   HGB 13.3 14.6  15.3 13.6 14.3 13.8  HCT 44.6 43.0  45.0 40.0 42.0 45.7  MCV 98.7  --   --   --  95.6  PLT 66*  --   --   --  57*   Cardiac Enzymes: No results for input(s): "CKTOTAL", "CKMB", "CKMBINDEX", "TROPONINI" in the last 168 hours. Sepsis Labs: Recent Labs  Lab 01/31/23 2234 01/31/23 2239 01/13/2023 0050 01/21/2023 0459 01/27/2023 0717  WBC 28.8*  --   --  13.6*  --   LATICACIDVEN  --  13.3* 11.8* >9.0* 7.8*    Procedures/Operations  11/29 ETT 11/29 DCCV 11/29 I/O 11/30 PICC   Kindsey Eblin E Amayra Kiedrowski 01/26/2023, 3:32 PM

## 2023-02-02 NOTE — Progress Notes (Signed)
4 pressors, max on NE epi vaso at 0.03 neo 180 Abrupt drop in SBP to 40s. STAT order placed to up-titrate neo to 442mcg/min given critical instability rendering typical titration parameters inappropriate.   Tessie Fass MSN, AGACNP-BC [facility] Pulmonary/Critical Care Medicine

## 2023-02-02 NOTE — Progress Notes (Signed)
EEG complete - results pending 

## 2023-02-02 NOTE — ED Notes (Signed)
Bair hugger applied to the pt

## 2023-02-02 NOTE — H&P (Addendum)
NAME:  Peter Steele, MRN:  [id number], DOB:  03-06-1970, LOS: 0 ADMISSION DATE:  01/31/2023, CONSULTATION DATE:  11/30 REFERRING MD:  Dr. Adela Lank, CHIEF COMPLAINT:  cardiac arrest   History of Present Illness:  Patient is a 52 yo M w/ pertinent PMH myotonic muscular dystrophy presents to [facility] ED on 11/29 w/ cardiac arrest.  On 11/29 patient was eating dinner then slumped over and became unresponsive.  Patient received about 5 minutes of bystander CPR until EMS arrived.  Patient asystole and ROSC after 20 minutes.  Patient was intubated in field.  Patient lost pulses again and an additional 4 minutes of CPR given until ROSC obtained.  Transferred to [facility] ED.  On arrival patient noted to be pulseless and CPR restarted.  ROSC achieved after 15 minutes.  EKG showing wide-complex irregular tachycardia.  CXR showing ETT right mainstem and ETT retracted 2 cm.  Levo started for hypotension.  Patient had 2 more episodes of cardiac arrest and received several rounds of CPR and ROSC obtained.  ED physician had conversation with sister who is POA and states no more further CPR but is okay with medication for now and understanding that patient has poor neurologic prognosis given prolonged CPR time.  Added vaso and epi for hypotension.  VBG 6.81, 75, 47, 12.  Bicarb drip added. PCCM consulted for ICU admission.  Pertinent ED labs: WBC 28, platelets 66, troponin 241, LA 13.3 then 11.8  Pertinent  Medical History   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HCC)    Followed by [facility] Neurology for this     Significant Hospital Events: Including procedures, antibiotic start and stop dates in addition to other pertinent events   11/29 cardiac arrest  Interim History / Subjective:  See above  Objective   Blood pressure (!) 92/58, pulse 62, temperature (!) 94.6 F (34.8 C), resp. rate 12, SpO2 100%.    Vent Mode: PRVC FiO2 (%):  [100 %] 100 % Set Rate:  [12 bmp] 12 bmp Vt  Set:  [490 mL] 490 mL PEEP:  [5 cmH20] 5 cmH20  No intake or output data in the 24 hours ending 01/06/2023 0023 There were no vitals filed for this visit.  Examination: General:  critically ill appearing on mech vent HEENT: MM pink/moist; ETT in place Neuro: pupils fixed/dilated; no response to painful stimuli; no cough/gag CV: s1s2, irregular tachy 130s, no m/r/g PULM:  dim clear BS bilaterally; on mech vent PRVC GI: soft, bsx4 active  Extremities: warm/dry, no edema  Skin: no rashes or lesions    Resolved Hospital Problem list     Assessment & Plan:    Cardiac arrest: ROSC >45 minutes Plan: -will admit to ICU w/ continuous telemetry monitoring -continue pressors for MAP goal >65 -trend troponin and lactate -RR on vent and bicarb drip increased; check ABG in few hours -check CMP, Mag -echo -CXR  Acute respiratory failure w/ hypercapnia Possible aspiration Plan: -LTVV strategy with tidal volumes of 6-8 cc/kg ideal body weight -check ABG and adjust settings accordingly  -Goal plateau pressures less than 30 and driving pressures less than 15 -Wean PEEP/FiO2 for SpO2 >92% -VAP bundle in place -Daily SAT and SBT -PAD protocol in place -wean sedation for RASS goal 0 to -1 -Follow intermittent CXR and ABG PRN -rocephin for possible aspiration -trach aspirate  Acute encephalopathy: high concern for anoxic brain injury given prolonged cpr; also hypercarbic Plan: -RR on vent increased; trend abg -CT head -  send UDS, ethanol -EEG   Hyperkalemia: 7.5 on istat Plan: -given albuterol, bicarb, calcium, insulin/dextrose -CMP  AKI Plan: -Trend BMP / urinary output -Replace electrolytes as indicated -Avoid nephrotoxic agents, ensure adequate renal perfusion  Hyperglycemia Plan: -A1c  -ssi and cbg monitoring  Leukocytosis Plan: -likely reactive -rocephin as above for possible aspiration -trend wbc/fever curve  Thrombocytopenia Plan: -trend cbc  Hx of  myotonic muscular dystrophy Plan: -supportive care   Best Practice (right click and "Reselect all SmartList Selections" daily)   Diet/type: NPO w/ meds via tube DVT prophylaxis: hold until CT head done    Pressure ulcer(s): not present on admission  GI prophylaxis: H2B Lines: N/A Foley:  Yes, and it is still needed Code Status:  DNR Last date of multidisciplinary goals of care discussion [11/30 spoke with sister and other family members at bedside. They made decision to be not perform any more compressions/shock if patient loses pulses again. They are okay keeping patient intubated and doing pressors for now.]  Labs   CBC: Recent Labs  Lab 01/31/23 2234 01/31/23 2239  WBC 28.8*  --   NEUTROABS 22.5*  --   HGB 13.3 14.6  15.3  HCT 44.6 43.0  45.0  MCV 98.7  --   PLT 66*  --     Basic Metabolic Panel: Recent Labs  Lab 01/31/23 2239  NA 131*  131*  K 7.4*  7.5*  CL 105  GLUCOSE 193*  BUN 63*  CREATININE 2.80*   GFR: CrCl cannot be calculated (Unknown ideal weight.). Recent Labs  Lab 01/31/23 2234 01/31/23 2239  WBC 28.8*  --   LATICACIDVEN  --  13.3*    Liver Function Tests: No results for input(s): "AST", "ALT", "ALKPHOS", "BILITOT", "PROT", "ALBUMIN" in the last 168 hours. No results for input(s): "LIPASE", "AMYLASE" in the last 168 hours. No results for input(s): "AMMONIA" in the last 168 hours.  ABG    Component Value Date/Time   HCO3 12.2 (L) 01/31/2023 2239   TCO2 14 (L) 01/31/2023 2239   TCO2 16 (L) 01/31/2023 2239   ACIDBASEDEF 23.0 (H) 01/31/2023 2239   O2SAT 47 01/31/2023 2239     Coagulation Profile: No results for input(s): "INR", "PROTIME" in the last 168 hours.  Cardiac Enzymes: No results for input(s): "CKTOTAL", "CKMB", "CKMBINDEX", "TROPONINI" in the last 168 hours.  HbA1C: Hemoglobin A1C  Date/Time Value Ref Range Status  04/16/2018 03:55 PM 5.3 4.0 - 5.6 % Final    CBG: Recent Labs  Lab 01/31/23 2233  GLUCAP 175*     Review of Systems:   Patient is encephalopathic and/or intubated; therefore, history has been obtained from chart review.    Past Medical History:  He,  has a past medical history of Myotonic muscular dystrophy (HCC).   Surgical History:   Past Surgical History:  Procedure Laterality Date   ADENOIDECTOMY     CHOLECYSTECTOMY     Can't remember year   COLONOSCOPY N/A 08/23/2020   Procedure: COLONOSCOPY;  Surgeon: Corbin Ade, MD;  Location: [facility];  Service: Endoscopy;  Laterality: N/A;  ASA II / 12:00   POLYPECTOMY  08/23/2020   Procedure: POLYPECTOMY;  Surgeon: Corbin Ade, MD;  Location: [facility];  Service: Endoscopy;;     Social History:   reports that he quit smoking about 27 years ago. His smoking use included cigarettes. He has never used smokeless tobacco. He reports that he does not currently use alcohol. He reports that he does  not use drugs.   Family History:  His family history includes Asthma in his sister; Diabetes in his father; Early death in his mother; Heart disease in his father, mother, and sister; High blood pressure in his father and sister; Hyperlipidemia in his father and sister; Stroke in his mother.   Allergies Allergies  Allergen Reactions   Bee Venom Anaphylaxis and Swelling   Vinegar [Acetic Acid] Anaphylaxis   Doxycycline Hyclate Nausea And Vomiting and Other (See Comments)    Gi upset     Home Medications  Prior to Admission medications   Medication Sig Start Date End Date Taking? Authorizing Provider  ketorolac (TORADOL) 10 MG tablet Take 10 mg by mouth every 6 (six) hours as needed. 01/28/23  Yes [provider]  ondansetron (ZOFRAN-ODT) 4 MG disintegrating tablet Take 2 mg by mouth every 6 (six) hours as needed. 01/28/23  Yes [provider]  tamsulosin (FLOMAX) 0.4 MG CAPS capsule Take 0.4 mg by mouth daily. 01/28/23  Yes [provider]     Critical care time: 45 minutes     JD Daryel November Pulmonary & Critical Care 01/07/2023, 12:23 AM  Please see [email or web address] for pager details.  From 7A-7P if no response, please call [phone]. After hours, please call ELink [phone].

## 2023-02-02 NOTE — ED Provider Notes (Signed)
Called to bedside for clinical change.  Patient awaiting ICU bed, ROSC, intubated.  Nursing concerned that patient's entire body is getting puffy.  Also demonstrating new A-fib with RVR, worsening blood pressure.  On my exam patient has total body crepitus, received CPR and now on the ventilator with positive pressure.  Highly concerning for tension pneumothorax.  No obvious tracheal deviation.  Rapid portable x-ray confirms tension pneumothorax on the left.  Finger thoracostomy performed with good release of air.  Chest tube placed.  Vital signs improving.  .Critical Care  Performed by: Sabas Sous, MD Authorized by: Sabas Sous, MD   Critical care provider statement:    Critical care time (minutes):  35   Critical care was necessary to treat or prevent imminent or life-threatening deterioration of the following conditions:  Shock   Critical care was time spent personally by me on the following activities:  Development of treatment plan with patient or surrogate, discussions with consultants, evaluation of patient's response to treatment, examination of patient, ordering and review of laboratory studies, ordering and review of radiographic studies, ordering and performing treatments and interventions, pulse oximetry, re-evaluation of patient's condition and review of old charts CHEST TUBE INSERTION  Date/Time: 01/12/2023 2:02 AM  Performed by: Sabas Sous, MD Authorized by: Sabas Sous, MD   Consent:    Consent obtained:  Emergent situation   Consent given by: family at bedside. Universal protocol:    Patient identity confirmed:  Anonymous protocol, patient vented/unresponsive Pre-procedure details:    Skin preparation:  Povidone-iodine Procedure details:    Scalpel size:  11   Tube size (Fr):  24   Dissection instrument:  Kelly clamp and finger   Ultrasound guidance: no     Tension pneumothorax: yes     Tube connected to:  Suction   Drainage characteristics:  Air  only   Suture material:  0 silk   Dressing:  4x4 sterile gauze Post-procedure details:    Post-insertion x-ray findings: tube in good position     Procedure completion:  Tolerated well, no immediate complications Comments:     Emergent finger thoracostomy performed with release of the tension.  Chest tube then placed and sutured in.     Sabas Sous, MD 01/18/2023 [phone]

## 2023-02-02 NOTE — ED Notes (Signed)
Pt skin noted to be mottled, HR in the 170s, and edema to the R chest and neck. Dr. Pilar Plate made aware and at the bedside.

## 2023-02-02 NOTE — Progress Notes (Signed)
Called and spoke to pts sister.   He is persistently acidemic despite cont bicarb gtt, Afib RVR, persistent hypoxia & hypercarbia, and multisystem organ failure otherwise.  He is now on 4 pressors. Remai  I am concerned that Kumari might die in the near future. He is DNR, but hope has been for organ donor workup. We are trying to stabilize him, but I worry he may not have long. I advised trying to come to the hospital as soon as they are able to be with him given his tenuous and declining status.   Will try out best to support Sawyer and await word from HB-- discussions re candidacy will occur between HB and family.        Tessie Fass MSN, AGACNP-BC [facility] Pulmonary/Critical Care Medicine 01/07/2023, 8:29 AM

## 2023-02-02 NOTE — Progress Notes (Signed)
Echocardiogram 2D Echocardiogram has been performed.  Warren Lacy Enora Trillo RDCS 01/18/2023, 11:58 AM

## 2023-02-02 DEATH — deceased

## 2023-04-25 ENCOUNTER — Ambulatory Visit: Payer: Medicaid Other | Admitting: Internal Medicine
